# Patient Record
Sex: Male | Born: 1990 | Race: Black or African American | Hispanic: No | Marital: Single | Smoking: Current every day smoker
Health system: Southern US, Community
[De-identification: ages and names within clinical notes are randomized; demographics above are authoritative.]

## PROBLEM LIST (undated history)

## (undated) DIAGNOSIS — I10 Essential (primary) hypertension: Secondary | ICD-10-CM

---

## 2016-04-07 ENCOUNTER — Encounter (HOSPITAL_COMMUNITY): Payer: Self-pay

## 2016-04-07 ENCOUNTER — Emergency Department (HOSPITAL_COMMUNITY)
Admission: EM | Admit: 2016-04-07 | Discharge: 2016-04-07 | Disposition: A | Discharge status: Home / Self Care | Payer: Self-pay | Attending: Physician Assistant | Admitting: Physician Assistant

## 2016-04-07 DIAGNOSIS — Z87891 Personal history of nicotine dependence: Secondary | ICD-10-CM | POA: Insufficient documentation

## 2016-04-07 DIAGNOSIS — H5789 Other specified disorders of eye and adnexa: Secondary | ICD-10-CM

## 2016-04-07 DIAGNOSIS — H578 Other specified disorders of eye and adnexa: Secondary | ICD-10-CM | POA: Insufficient documentation

## 2016-04-07 DIAGNOSIS — H538 Other visual disturbances: Secondary | ICD-10-CM | POA: Insufficient documentation

## 2016-04-07 MED ORDER — FLUORESCEIN SODIUM 1 MG OP STRP
1.0000 | ORAL_STRIP | Freq: Once | OPHTHALMIC | Status: DC
  Filled 2016-04-07: qty 1

## 2016-04-07 MED ORDER — TETRACAINE HCL 0.5 % OP SOLN
2.0000 [drp] | Freq: Once | OPHTHALMIC | Status: DC
  Filled 2016-04-07: qty 2

## 2016-04-07 NOTE — Discharge Instructions (Signed)
There is no signs of abrasions to her cornea today. You need to follow-up with an ophthalmologist. I have referred you and you need to try to schedule an appointment within the next week. Please return to the ED if your vision worsens or if you develop any other symptoms.

## 2016-04-07 NOTE — ED Provider Notes (Signed)
[facility]-EMERGENCY DEPT Provider Note   CSN: [id number] Arrival date & time: 04/07/16  1127  By signing my name below, I, Christopher Patton, attest that this documentation has been prepared under the direction and in the presence of AssurantKennith Nicolas Banh, PA-C. Electronically Signed: Javier Dockerobert Ryan Patton, ER Scribe. 12/15/2015. 12:29 PM.   History   Chief Complaint Chief Complaint  Patient presents with  . Eye Problem   HPI  HPI Comments: Christopher Patton is a 25 y.o. male who presents to the Emergency Department complaining of bilateral eye complaints. He endorses associated eye redness, worse in left along with itching bilaterally and blurry vision that has been ongoing for 6 months. His girlfriend is at bedside and states his eye are red every other day. Patient is a [employer] and thought that maybe something flew into his eyes at work 6 months ago. Denies any pain. He has not seen an eye doctor since he was a child. Patient states he wore glasses as a child and has not been complaint with wearing glasses since he was a child. He has not worn glasses of for the pat 10 years. He feels his vision is worsening. He denies crusting, eye pain, or eye discharge. Denies any other complaints.   History reviewed. No pertinent past medical history.  There are no active problems to display for this patient.   History reviewed. No pertinent surgical history.   Home Medications    Prior to Admission medications   Not on File    Family History No family history on file.  Social History Social History  Substance Use Topics  . Smoking status: Former Smoker    Quit date: 01/07/2016  . Smokeless tobacco: Never Used  . Alcohol use Yes     Comment: occassional     Allergies   Patient has no allergy information on record.   Review of Systems Review of Systems  Constitutional: Negative for chills and fever.  HENT: Negative for congestion, dental problem, ear pain and postnasal drip.   Eyes:  Positive for redness and visual disturbance. Negative for photophobia, pain, discharge and itching.  Neurological: Negative for dizziness and headaches.     Physical Exam Updated Vital Signs BP 145/99 (BP Location: Right Arm)   Pulse 70   Temp 97.4 F (36.3 C) (Oral)   Resp 18   Ht 6' 5.5" (1.969 m)   Wt (!) 345 lb (156.5 kg)   SpO2 100%   BMI 40.38 kg/m   Physical Exam  Constitutional: He is oriented to person, place, and time. He appears well-developed and well-nourished. No distress.  HENT:  Head: Normocephalic and atraumatic.  Right Ear: External ear normal.  Left Ear: External ear normal.  Nose: Nose normal.  Mouth/Throat: Oropharynx is clear and moist.  Eyes: Conjunctivae, EOM and lids are normal. Pupils are equal, round, and reactive to light. Lids are everted and swept, no foreign bodies found. Right eye exhibits no chemosis, no discharge, no exudate and no hordeolum. Left eye exhibits no chemosis, no discharge, no exudate and no hordeolum. Right conjunctiva is not injected. Left conjunctiva is not injected.  Slit lamp exam:      The right eye shows no corneal abrasion, no corneal ulcer, no fluorescein uptake and no anterior chamber bulge.       The left eye shows no corneal abrasion, no corneal ulcer, no fluorescein uptake and no anterior chamber bulge.  Neck: Normal range of motion. Neck supple.  Cardiovascular: Normal rate.  Pulmonary/Chest: Effort normal. No respiratory distress.  Musculoskeletal: Normal range of motion.  Neurological: He is alert and oriented to person, place, and time. Coordination normal.  The patient is alert, attentive, and oriented x 3. Speech is clear. Cranial nerve II-VII grossly intact. Negative pronator drift. Sensation intact. Strength 5/5 in all extremities. Reflexes 2+ and symmetric at biceps, triceps, knees, and ankles. Rapid alternating movement and fine finger movements intact. Romberg is absent. Posture and gait normal. Peripheral  vision intact.  Skin: Skin is warm and dry. He is not diaphoretic.  Psychiatric: He has a normal mood and affect. His behavior is normal.  Nursing note and vitals reviewed.    Visual Acuity  Right Eye Distance: 20/70 Left Eye Distance: 20/50 Bilateral Distance: 20/50  Right Eye Near:   Left Eye Near:    Bilateral Near:      ED Treatments / Results  Labs (all labs ordered are listed, but only abnormal results are displayed) Labs Reviewed - No data to display  EKG  EKG Interpretation None       Radiology No results found.  Procedures Procedures (including critical care time)  Medications Ordered in ED Medications  fluorescein ophthalmic strip 1 strip (not administered)  tetracaine (PONTOCAINE) 0.5 % ophthalmic solution 2 drop (not administered)     Initial Impression / Assessment and Plan / ED Course  I have reviewed the triage vital signs and the nursing notes.  Pertinent labs & imaging results that were available during my care of the patient were reviewed by me and considered in my medical decision making (see chart for details).  Clinical Course   Patient presents to the ED with bilat worsening vision, eye redness, and itching that has been ongoing for the past 6 months. No focal neuro deficits. Low suspicion for CVA. Eye exam unconcerned for corneal abrasion or ulceration or foreign body. Visual acuity poor in both eye. Patient states that when he was younger he was suppose to wear glasses but decided not to. HE feels his vision is worsening. Patient without injected conjunctivae and pain. Low suspicion for conjunctivitis, uevitis, or glaucoma. I feel this patients symptoms are not life threatening and he needs to follow up with ophtho for glasses. Patient is agreeable to the above plan. Pt is hemodynamically stable, in NAD, & able to ambulate in the ED. Pt is comfortable with above plan and is stable for discharge at this time. All questions were answered prior to  disposition. Strict return precautions for f/u to the ED were discussed. Patient discussed with Dr. Corlis LeakMackuen who is agreeable to the above plan.     Final Clinical Impressions(s) / ED Diagnoses   Final diagnoses:  Eye redness  Eye irritation  Blurry vision, bilateral    New Prescriptions New Prescriptions   No medications on file      I personally performed the services described in this documentation, which was scribed in my presence. The recorded information has been reviewed and is accurate.        Rise MuKenneth T Jaylee Lantry, PA-C 04/08/16 2327    Courteney Randall AnLyn Mackuen, MD 04/10/16 1616

## 2016-04-07 NOTE — ED Notes (Signed)
Declined W/C at D/C and was escorted to lobby by RN. 

## 2016-04-07 NOTE — ED Notes (Signed)
PT said that when he was younger he was supposed to wear glasses but decided not to.

## 2016-04-07 NOTE — ED Triage Notes (Signed)
Pt. Woke up with lt. Eye is blurred and red and itchy.  It is also his rt. Eye but the lt. Is worse.  He reports having these symptoms start 6 months ago.  He thinks that something flew into his eyes under his safety glasses.  Skin is warm and dry.  Pt. 's  Girlfriend stated that his eye is red every other day.

## 2016-07-09 ENCOUNTER — Ambulatory Visit (HOSPITAL_COMMUNITY)
Admission: EM | Admit: 2016-07-09 | Discharge: 2016-07-09 | Disposition: A | Discharge status: Home / Self Care | Payer: BLUE CROSS/BLUE SHIELD | Attending: Family Medicine | Admitting: Family Medicine

## 2016-07-09 ENCOUNTER — Encounter (HOSPITAL_COMMUNITY): Payer: Self-pay | Admitting: Emergency Medicine

## 2016-07-09 DIAGNOSIS — J029 Acute pharyngitis, unspecified: Secondary | ICD-10-CM | POA: Insufficient documentation

## 2016-07-09 DIAGNOSIS — B9789 Other viral agents as the cause of diseases classified elsewhere: Secondary | ICD-10-CM

## 2016-07-09 DIAGNOSIS — J069 Acute upper respiratory infection, unspecified: Secondary | ICD-10-CM

## 2016-07-09 HISTORY — DX: Essential (primary) hypertension: I10

## 2016-07-09 LAB — POCT URINALYSIS DIP (DEVICE)
BILIRUBIN URINE: NEGATIVE
Glucose, UA: NEGATIVE mg/dL
HGB URINE DIPSTICK: NEGATIVE
KETONES UR: NEGATIVE mg/dL
LEUKOCYTES UA: NEGATIVE
NITRITE: NEGATIVE
Protein, ur: NEGATIVE mg/dL
Specific Gravity, Urine: 1.025 (ref 1.005–1.030)
Urobilinogen, UA: 0.2 mg/dL (ref 0.0–1.0)
pH: 6.5 (ref 5.0–8.0)

## 2016-07-09 LAB — POCT INFECTIOUS MONO SCREEN: Mono Screen: NEGATIVE

## 2016-07-09 LAB — POCT RAPID STREP A: Streptococcus, Group A Screen (Direct): NEGATIVE

## 2016-07-09 MED ORDER — IPRATROPIUM BROMIDE 0.06 % NA SOLN: 2.0000 | Freq: Four times a day (QID) | NASAL | 0 refills | Status: DC

## 2016-07-09 NOTE — ED Provider Notes (Signed)
CSN: [id number]     Arrival date & time 07/09/16  1640 History   None    Chief Complaint  Patient presents with  . Jaw Pain    lower   (Consider location/radiation/quality/duration/timing/severity/associated sxs/prior Treatment) Patinet c/o sore throat and lymph node swelling.  He recently was treated for UTI.   The history is provided by the patient.  URI  Presenting symptoms: congestion and fatigue   Severity:  Mild Onset quality:  Sudden Timing:  Constant Progression:  Worsening Chronicity:  New Relieved by:  Nothing Worsened by:  Nothing Ineffective treatments:  None tried   Past Medical History:  Diagnosis Date  . Hypertension    History reviewed. No pertinent surgical history. History reviewed. No pertinent family history. Social History  Substance Use Topics  . Smoking status: Current Every Day Smoker    Types: Cigars  . Smokeless tobacco: Never Used     Comment: Black & Milds 3-4/day  . Alcohol use Yes     Comment: occassional    Review of Systems  Constitutional: Positive for fatigue.  HENT: Positive for congestion.   Eyes: Negative.   Respiratory: Negative.   Cardiovascular: Negative.   Gastrointestinal: Negative.   Endocrine: Negative.   Genitourinary: Negative.   Musculoskeletal: Negative.   Allergic/Immunologic: Negative.   Neurological: Negative.   Hematological: Negative.   Psychiatric/Behavioral: Negative.     Allergies  Patient has no known allergies.  Home Medications   Prior to Admission medications   Medication Sig Start Date End Date Taking? Authorizing Provider  cetirizine (ZYRTEC) 10 MG tablet Take 10 mg by mouth daily.   Yes Historical Provider, MD  ipratropium (ATROVENT) 0.06 % nasal spray Place 2 sprays into both nostrils 4 (four) times daily. 07/09/16   Deatra CanterWilliam J Oxford, FNP   Meds Ordered and Administered this Visit  Medications - No data to display  BP 157/95 (BP Location: Left Arm)   Pulse 92   Temp 98.2 F (36.8 C)  (Oral)   SpO2 99%  No data found.   Physical Exam  Constitutional: He is oriented to person, place, and time. He appears well-developed and well-nourished.  HENT:  Head: Normocephalic and atraumatic.  Right Ear: External ear normal.  Left Ear: External ear normal.  Mouth/Throat: Oropharynx is clear and moist.  Eyes: Conjunctivae and EOM are normal. Pupils are equal, round, and reactive to light.  Neck: Normal range of motion. Neck supple.  Cardiovascular: Normal rate, regular rhythm and normal heart sounds.   Pulmonary/Chest: Effort normal and breath sounds normal.  Abdominal: Soft.  Neurological: He is alert and oriented to person, place, and time.  Nursing note and vitals reviewed.   Urgent Care Course     Procedures (including critical care time)  Labs Review Labs Reviewed  CULTURE, GROUP A STREP [facility]  POCT URINALYSIS DIP (DEVICE)  POCT INFECTIOUS MONO SCREEN  POCT RAPID STREP A    Imaging Review No results found.   Visual Acuity Review  Right Eye Distance:   Left Eye Distance:   Bilateral Distance:    Right Eye Near:   Left Eye Near:    Bilateral Near:         MDM   1. Viral URI    Ipratropium nasal spray  Push po fluids, rest, tylenol and motrin otc prn as directed for fever, arthralgias, and myalgias.  Follow up prn if sx's continue or persist.    Deatra CanterWilliam J Oxford, FNP 07/09/16 2043

## 2016-07-09 NOTE — ED Triage Notes (Signed)
Pt states he had a mild sore throat on Sunday.  Since then he has had pain in his lower jaw, below the chin.  He says his voice has become hoarse and his jaw will hurt when he swallows. He was treated for a UTI and then he started having these symptoms.

## 2016-07-10 LAB — CULTURE, GROUP A STREP (THRC)

## 2017-09-04 ENCOUNTER — Emergency Department (HOSPITAL_COMMUNITY)
Admission: EM | Admit: 2017-09-04 | Discharge: 2017-09-05 | Disposition: A | Discharge status: Home / Self Care | Payer: Self-pay | Attending: Emergency Medicine | Admitting: Emergency Medicine

## 2017-09-04 ENCOUNTER — Emergency Department (HOSPITAL_COMMUNITY): Payer: Self-pay

## 2017-09-04 ENCOUNTER — Encounter (HOSPITAL_COMMUNITY): Payer: Self-pay

## 2017-09-04 ENCOUNTER — Other Ambulatory Visit: Payer: Self-pay

## 2017-09-04 DIAGNOSIS — I10 Essential (primary) hypertension: Secondary | ICD-10-CM | POA: Insufficient documentation

## 2017-09-04 DIAGNOSIS — R002 Palpitations: Secondary | ICD-10-CM | POA: Insufficient documentation

## 2017-09-04 DIAGNOSIS — F17210 Nicotine dependence, cigarettes, uncomplicated: Secondary | ICD-10-CM | POA: Insufficient documentation

## 2017-09-04 DIAGNOSIS — R0789 Other chest pain: Secondary | ICD-10-CM | POA: Insufficient documentation

## 2017-09-04 DIAGNOSIS — R0602 Shortness of breath: Secondary | ICD-10-CM | POA: Insufficient documentation

## 2017-09-04 LAB — CBC
HCT: 42.3 % (ref 39.0–52.0)
Hemoglobin: 14.2 g/dL (ref 13.0–17.0)
MCH: 27 pg (ref 26.0–34.0)
MCHC: 33.6 g/dL (ref 30.0–36.0)
MCV: 80.6 fL (ref 78.0–100.0)
PLATELETS: 267 10*3/uL (ref 150–400)
RBC: 5.25 MIL/uL (ref 4.22–5.81)
RDW: 14.5 % (ref 11.5–15.5)
WBC: 7.5 10*3/uL (ref 4.0–10.5)

## 2017-09-04 LAB — BASIC METABOLIC PANEL
Anion gap: 11 (ref 5–15)
BUN: 10 mg/dL (ref 6–20)
CHLORIDE: 100 mmol/L — AB (ref 101–111)
CO2: 25 mmol/L (ref 22–32)
CREATININE: 1.1 mg/dL (ref 0.61–1.24)
Calcium: 9 mg/dL (ref 8.9–10.3)
Glucose, Bld: 124 mg/dL — ABNORMAL HIGH (ref 65–99)
Potassium: 3.5 mmol/L (ref 3.5–5.1)
SODIUM: 136 mmol/L (ref 135–145)

## 2017-09-04 LAB — I-STAT TROPONIN, ED: TROPONIN I, POC: 0.01 ng/mL (ref 0.00–0.08)

## 2017-09-04 NOTE — ED Triage Notes (Addendum)
Pt endorses "my heart just started pounding" and shob that began 30 minutes ago while having an argument with his significant other. Tachycardic 112 in triage. Has hx of htn. Denies CP. Speaking in complete sentences.

## 2017-09-04 NOTE — ED Notes (Signed)
Due to xray results of congestion, and hypertensive presentation, will upgrade acuity on patient to focus rooming

## 2017-09-05 LAB — I-STAT TROPONIN, ED: Troponin i, poc: 0 ng/mL (ref 0.00–0.08)

## 2017-09-05 LAB — BRAIN NATRIURETIC PEPTIDE: B NATRIURETIC PEPTIDE 5: 11 pg/mL (ref 0.0–100.0)

## 2017-09-05 NOTE — Discharge Instructions (Signed)
Your labs today were normal.  You were found to have pulmonary vascular congestion on your chest x-ray.  No signs of fluid around your heart or lungs today but we recommend close follow-up with her primary care physician and cardiologist for an outpatient echocardiogram.   To find a primary care or specialty doctor please call [phone] or [phone] to access "[place] Find a Doctor Service."  You may also go on the [facility] website at [email or web address]  There are also multiple [facility], [facility], [facility] practices throughout the [place] that are frequently accepting new patients. You may find a clinic that is close to your home and contact them.  [facility] -  [address] [place] [address] [phone]   [facility] Department -  [address] [place] [address] [phone]   [facility] Department [phone]  [place] [address] [phone]

## 2017-09-05 NOTE — ED Provider Notes (Signed)
TIME SEEN: 3:06 AM  CHIEF COMPLAINT: Chest pain, shortness of breath, palpitations  HPI: Patient is a 27 year old male with history of obesity, tobacco use, hypertension who presents to the emergency department with chest pain and shortness of breath.  States tonight around 9:15 PM after smoking a cigarette he felt like his heart was beating fast and irregularly.  States he had some left-sided chest tightness and intermittent shortness of breath.  Symptoms have resolved.  No nausea or vomiting.  No diaphoresis or dizziness.  Denies any cardiac history.  No history of PE or DVT.  No lower extremity swelling or pain.  ROS: See HPI Constitutional: no fever  Eyes: no drainage  ENT: no runny nose   Cardiovascular:   chest pain  Resp:  SOB  GI: no vomiting GU: no dysuria Integumentary: no rash  Allergy: no hives  Musculoskeletal: no leg swelling  Neurological: no slurred speech ROS otherwise negative  PAST MEDICAL HISTORY/PAST SURGICAL HISTORY:  Past Medical History:  Diagnosis Date  . Hypertension     MEDICATIONS:  Prior to Admission medications   Medication Sig Start Date End Date Taking? Authorizing Provider  ipratropium (ATROVENT) 0.06 % nasal spray Place 2 sprays into both nostrils 4 (four) times daily. Patient not taking: Reported on 09/05/2017 07/09/16   Deatra Canter, FNP    ALLERGIES:  No Known Allergies  SOCIAL HISTORY:  Social History   Tobacco Use  . Smoking status: Current Every Day Smoker    Types: Cigars  . Smokeless tobacco: Never Used  . Tobacco comment: Black & Milds 3-4/day  Substance Use Topics  . Alcohol use: Yes    Comment: occassional    FAMILY HISTORY: History reviewed. No pertinent family history.  EXAM: BP (!) 152/103 (BP Location: Left Arm)   Pulse 86   Temp 97.9 F (36.6 C) (Oral)   Resp 16   Ht  (1.956 m)   Wt (!) 156.5 kg (345 lb)   SpO2 100%   BMI 40.91 kg/m  CONSTITUTIONAL: Alert and oriented and responds appropriately to  questions. Well-appearing; well-nourished [place] HEAD: Normocephalic EYES: Conjunctivae clear, pupils appear equal, EOMI ENT: normal nose; moist mucous membranes NECK: Supple, no meningismus, no nuchal rigidity, no LAD  CARD: RRR; S1 and S2 appreciated; no murmurs, no clicks, no rubs, no gallops RESP: Normal chest excursion without splinting or tachypnea; breath sounds clear and equal bilaterally; no wheezes, no rhonchi, no rales, no hypoxia or respiratory distress, speaking full sentences ABD/GI: Normal bowel sounds; non-distended; soft, non-tender, no rebound, no guarding, no peritoneal signs, no hepatosplenomegaly BACK:  The back appears normal and is non-tender to palpation, there is no CVA tenderness EXT: Normal ROM in all joints; non-tender to palpation; no edema; normal capillary refill; no cyanosis, no calf tenderness or swelling    SKIN: Normal color for age and race; warm; no rash NEURO: Moves all extremities equally PSYCH: The patient's mood and manner are appropriate. Grooming and personal hygiene are appropriate.  MEDICAL DECISION MAKING: Patient here with palpitations, chest pain or shortness of breath.  He does have risk factors for ACS.  First troponin is negative.  EKG shows no ischemic abnormality or arrhythmia.  Chest x-ray shows pulmonary vascular congestion without edema.  No cardiomegaly noted.  His BNP is normal at 11.  Plan is to repeat second troponin and if this is negative discharge with close outpatient follow-up with cardiology as he may benefit from echocardiogram.  His blood pressure has improved without intervention  in the ED.  Doubt PE.  Doubt dissection.  ED PROGRESS: Second troponin is negative.  We will have him follow-up with PCP and cardiology as an outpatient.  Discussed return precautions.   At this time, I do not feel there is any life-threatening condition present. I have reviewed and discussed all results (EKG, imaging, lab, urine as appropriate) and exam  findings with patient/family. I have reviewed nursing notes and appropriate previous records.  I feel the patient is safe to be discharged home without further emergent workup and can continue workup as an outpatient as needed. Discussed usual and customary return precautions. Patient/family verbalize understanding and are comfortable with this plan.  Outpatient follow-up has been provided if needed. All questions have been answered.      EKG Interpretation  Date/Time:  Friday Sep 04 2017 21:51:48 EDT Ventricular Rate:  108 PR Interval:  176 QRS Duration: 82 QT Interval:  350 QTC Calculation: 469 R Axis:   81 Text Interpretation:  Sinus tachycardia Otherwise normal ECG No old tracing to compare Confirmed by Adonias Demore, Baxter Hire [phone]) on 09/05/2017 3:07:02 AM         Jhace Fennell, Layla Maw, DO 09/05/17 6045

## 2017-10-05 ENCOUNTER — Other Ambulatory Visit: Payer: Self-pay

## 2017-10-05 ENCOUNTER — Emergency Department (HOSPITAL_COMMUNITY): Payer: Self-pay

## 2017-10-05 ENCOUNTER — Emergency Department (HOSPITAL_COMMUNITY)
Admission: EM | Admit: 2017-10-05 | Discharge: 2017-10-05 | Disposition: A | Discharge status: Home / Self Care | Payer: Self-pay | Attending: Emergency Medicine | Admitting: Emergency Medicine

## 2017-10-05 ENCOUNTER — Encounter (HOSPITAL_COMMUNITY): Payer: Self-pay | Admitting: Emergency Medicine

## 2017-10-05 DIAGNOSIS — R42 Dizziness and giddiness: Secondary | ICD-10-CM | POA: Insufficient documentation

## 2017-10-05 DIAGNOSIS — I10 Essential (primary) hypertension: Secondary | ICD-10-CM | POA: Insufficient documentation

## 2017-10-05 DIAGNOSIS — F17290 Nicotine dependence, other tobacco product, uncomplicated: Secondary | ICD-10-CM | POA: Insufficient documentation

## 2017-10-05 LAB — BASIC METABOLIC PANEL
ANION GAP: 8 (ref 5–15)
BUN: 6 mg/dL (ref 6–20)
CALCIUM: 9.1 mg/dL (ref 8.9–10.3)
CO2: 27 mmol/L (ref 22–32)
Chloride: 103 mmol/L (ref 101–111)
Creatinine, Ser: 1.11 mg/dL (ref 0.61–1.24)
Glucose, Bld: 114 mg/dL — ABNORMAL HIGH (ref 65–99)
POTASSIUM: 4.2 mmol/L (ref 3.5–5.1)
Sodium: 138 mmol/L (ref 135–145)

## 2017-10-05 LAB — I-STAT TROPONIN, ED: Troponin i, poc: 0 ng/mL (ref 0.00–0.08)

## 2017-10-05 LAB — CBC
HEMATOCRIT: 44.9 % (ref 39.0–52.0)
HEMOGLOBIN: 14.2 g/dL (ref 13.0–17.0)
MCH: 26.1 pg (ref 26.0–34.0)
MCHC: 31.6 g/dL (ref 30.0–36.0)
MCV: 82.4 fL (ref 78.0–100.0)
Platelets: 269 10*3/uL (ref 150–400)
RBC: 5.45 MIL/uL (ref 4.22–5.81)
RDW: 15.1 % (ref 11.5–15.5)
WBC: 4.5 10*3/uL (ref 4.0–10.5)

## 2017-10-05 LAB — URINALYSIS, ROUTINE W REFLEX MICROSCOPIC
BILIRUBIN URINE: NEGATIVE
Glucose, UA: NEGATIVE mg/dL
Hgb urine dipstick: NEGATIVE
Ketones, ur: NEGATIVE mg/dL
Leukocytes, UA: NEGATIVE
Nitrite: NEGATIVE
PH: 7 (ref 5.0–8.0)
Protein, ur: NEGATIVE mg/dL
SPECIFIC GRAVITY, URINE: 1.002 — AB (ref 1.005–1.030)

## 2017-10-05 LAB — CBG MONITORING, ED: GLUCOSE-CAPILLARY: 97 mg/dL (ref 65–99)

## 2017-10-05 LAB — D-DIMER, QUANTITATIVE: D-Dimer, Quant: <0.27 ug/mL-FEU (ref 0.00–0.50)

## 2017-10-05 MED ORDER — SODIUM CHLORIDE 0.9 % IV BOLUS
1000.0000 mL | Freq: Once | INTRAVENOUS | Status: AC
Start: 2017-10-05 — End: 2017-10-05
  Administered 2017-10-05: 1000 mL via INTRAVENOUS

## 2017-10-05 MED ORDER — MECLIZINE HCL 25 MG PO TABS: 25.0000 mg | ORAL_TABLET | Freq: Three times a day (TID) | ORAL | 0 refills | Status: DC | PRN

## 2017-10-05 MED ORDER — MECLIZINE HCL 25 MG PO TABS
25.0000 mg | ORAL_TABLET | Freq: Once | ORAL | Status: AC
  Administered 2017-10-05: 25 mg via ORAL
  Filled 2017-10-05: qty 1

## 2017-10-05 NOTE — ED Provider Notes (Signed)
[facility] EMERGENCY DEPARTMENT Provider Note   CSN: [id number] Arrival date & time: 10/05/17  4098     History   Chief Complaint Chief Complaint  Patient presents with  . Dizziness    HPI Christopher Patton is a 27 y.o. male past medical history of hypertension who presents for evaluation of lightheadedness, intermittent chest pain is been ongoing for last couple days.  Patient reports that he was recently in [place] and states that he was working at a job and states that he thinks he got dehydrated.  Patient states that he was working indoors and states that there is a air conditioning it was not easily ventilated.  Patient reports that he had some episodes where he got dizzy and felt lightheaded like he was going to pass out.  Patient reports that he traveled home Saturday.  He states that he went on 09/22/17 via a 3-hour airplane ride and returned 2 days ago.  Patient reports that this morning, he had some of the same feelings of lightheadedness and dizziness.  He states that it is worse when he moves his head or stands up from a sitting position.  He denies any nausea or vomiting.  Patient reports that he has had some intermittent chest pain.  Patient originally stated that this is been ongoing for several months but states that it worsened in the last few days.  Is not worse with exertion or deep inspiration.  He has no associated nausea, vomiting, diaphoresis.  Patient has been able to ambulate without any difficulty.  Denies any preceding trauma, injury, fall.  Patient has been in his normal state of health.  He does smoke black in miles.  He denies any cocaine use.  Patient also reports he has had some intermittent polydipsia and polyuria.  Patient reports that over the last few days, he is going to the bathroom approximately 5-6 times a day.  He states he is not having any nocturia.  Patient denies any fevers, vision changes, difficulty breathing, abdominal pain,  numbness/weakness of his arms or legs.  Patient denies any surgeries, hospitalizations, leg swelling, history of DVT or blood clots.  He does report that he recently had to 3-hour plane rides in the last 2 weeks.  The history is provided by the patient.    Past Medical History:  Diagnosis Date  . Hypertension     There are no active problems to display for this patient.   History reviewed. No pertinent surgical history.      Home Medications    Prior to Admission medications   Medication Sig Start Date End Date Taking? Authorizing Provider  bismuth subsalicylate (PEPTO BISMOL) 262 MG chewable tablet Chew 524 mg by mouth as needed for indigestion or diarrhea or loose stools.   Yes [provider]  cetirizine (ZYRTEC) 10 MG tablet Take 10 mg by mouth as needed for allergies.   Yes [provider]  ipratropium (ATROVENT) 0.06 % nasal spray Place 2 sprays into both nostrils 4 (four) times daily. Patient not taking: Reported on 09/05/2017 07/09/16   Deatra Canter, FNP  meclizine (ANTIVERT) 25 MG tablet Take 1 tablet (25 mg total) by mouth 3 (three) times daily as needed for dizziness. 10/05/17   Maxwell Caul, PA-C    Family History History reviewed. No pertinent family history.  Social History Social History   Tobacco Use  . Smoking status: Current Every Day Smoker    Types: Cigars  . Smokeless tobacco:  Never Used  . Tobacco comment: Black & Milds 3-4/day  Substance Use Topics  . Alcohol use: Yes    Comment: occassional  . Drug use: No     Allergies   Patient has no known allergies.   Review of Systems Review of Systems  Constitutional: Negative for chills and fever.  Eyes: Negative for visual disturbance.  Respiratory: Negative for shortness of breath.   Cardiovascular: Positive for chest pain. Negative for leg swelling.  Gastrointestinal: Negative for abdominal pain, nausea and vomiting.  Endocrine: Positive for polydipsia and polyuria.    Musculoskeletal: Negative for back pain and neck pain.  Neurological: Positive for dizziness and light-headedness. Negative for weakness and numbness.  All other systems reviewed and are negative.    Physical Exam Updated Vital Signs BP 132/78   Pulse 65   Temp 98 F (36.7 C)   Resp 20   Ht 6\' 5"  (1.956 m)   Wt (!) 163.3 kg (360 lb)   SpO2 100%   BMI 42.69 kg/m   Physical Exam  Constitutional: He is oriented to person, place, and time. He appears well-developed and well-nourished.  HENT:  Head: Normocephalic and atraumatic.  Mouth/Throat: Oropharynx is clear and moist and mucous membranes are normal.  Eyes: Pupils are equal, round, and reactive to light. Conjunctivae and lids are normal. Right eye exhibits nystagmus. Left eye exhibits nystagmus.  Slight horizontal nystagmus noted to left.  No vertical or rotational nystagmus.   Neck: Full passive range of motion without pain.  Cardiovascular: Normal rate, regular rhythm, normal heart sounds and normal pulses. Exam reveals no gallop and no friction rub.  No murmur heard. Pulmonary/Chest: Effort normal and breath sounds normal.  Lungs clear to auscultation bilaterally.  Symmetric chest rise.  No wheezing, rales, rhonchi.  Abdominal: Soft. Normal appearance. There is no tenderness. There is no rigidity and no guarding.  Musculoskeletal: Normal range of motion.  Bilateral lower extremities are symmetric in appearance.  No overlying warmth, erythema, edema.  Neurological: He is alert and oriented to person, place, and time.  Cranial nerves III-XII intact Follows commands, Moves all extremities  5/5 strength to BUE and BLE  Sensation intact throughout all major nerve distributions Normal finger to nose. No dysdiadochokinesia. No pronator drift. No gait abnormalities  No slurred speech. No facial droop.   Skin: Skin is warm and dry. Capillary refill takes less than 2 seconds.  Psychiatric: He has a normal mood and affect. His  speech is normal.  Nursing note and vitals reviewed.    ED Treatments / Results  Labs (all labs ordered are listed, but only abnormal results are displayed) Labs Reviewed  BASIC METABOLIC PANEL - Abnormal; Notable for the following components:      Result Value   Glucose, Bld 114 (*)    All other components within normal limits  URINALYSIS, ROUTINE W REFLEX MICROSCOPIC - Abnormal; Notable for the following components:   Color, Urine STRAW (*)    Specific Gravity, Urine 1.002 (*)    All other components within normal limits  CBC  D-DIMER, QUANTITATIVE (NOT AT [facility])  CBG MONITORING, ED  I-STAT TROPONIN, ED    EKG None  Radiology Dg Chest 2 View  Result Date: 10/05/2017 CLINICAL DATA:  Dizziness and chest pain EXAM: CHEST - 2 VIEW COMPARISON:  09/04/2017 FINDINGS: Cardiac shadow is stable. The lungs are well aerated bilaterally. Mild vascular congestion is again seen without significant interstitial edema or infiltrate. No effusions are seen. No bony abnormality  is noted. IMPRESSION: Stable mild vascular congestion. Electronically Signed   By: Alcide CleverMark  Lukens M.D.   On: 10/05/2017 10:44    Procedures Procedures (including critical care time)  Medications Ordered in ED Medications  sodium chloride 0.9 % bolus 1,000 mL (0 mLs Intravenous Stopped 10/05/17 1145)  meclizine (ANTIVERT) tablet 25 mg (25 mg Oral Given 10/05/17 1012)     Initial Impression / Assessment and Plan / ED Course  I have reviewed the triage vital signs and the nursing notes.  Pertinent labs & imaging results that were available during my care of the patient were reviewed by me and considered in my medical decision making (see chart for details).     27 year old male who presents for evaluation of dizziness and lightheadedness that is been intermittent for the last few days.  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  Thinks he is dehydrated.  No recent fevers, vision changes,  numbness/weakness of his extremities.  Does report he is having some intermittent chest pain that has been ongoing for several weeks. Patient is afebrile, non-toxic appearing, sitting comfortably on examination table. Vital signs reviewed and stable. On exam, he does have some horizontal nystagmus noted to the left.  No vertical or rotational nystagmus.  No neuro deficits noted on exam.  Consider infectious etiology versus vertigo versus dehydration.  Low suspicion for PE but also consideration given recent travel.  Also consider ACS etiology, though patient is low risk.  3/physical exam is not concerning for CVA, ICH. Plan for fluids and antiemetics.  I-STAT troponin negative.  Given the patient's pain has been ongoing for the last several weeks, one troponin is sufficient for rule out.  D-dimer is negative.  UA negative for any acute infectious etiology.  BMP shows slight hyperglycemia 114 but otherwise unremarkable.  CBC without any significant leukocytosis, anemia.  X-ray shows some mild vascular congestion.  Otherwise unremarkable.  EKG shows normal sinus rhythm.  Discussed results with patient.  He reports feeling better after fluids and medications.  Patient is able to ambulate in the department any difficulty.  He is tolerated p.o. in the department any difficulty.  Patient stable for discharge at this time.  Plan for outpatient referral [facility] for further primary care evaluation. Patient had ample opportunity for questions and discussion. All patient's questions were answered with full understanding. Strict return precautions discussed. Patient expresses understanding and agreement to plan.   Final Clinical Impressions(s) / ED Diagnoses   Final diagnoses:  Dizziness  Vertigo    ED Discharge Orders        Ordered    meclizine (ANTIVERT) 25 MG tablet  3 times daily PRN,   Status:  Discontinued     10/05/17 1345    meclizine (ANTIVERT) 25 MG tablet  3 times daily PRN      10/05/17 1348       Maxwell CaulLayden, Emunah Texidor A, PA-C 10/05/17 1613    Arby BarrettePfeiffer, Marcy, MD 10/16/17 [phone]

## 2017-10-05 NOTE — ED Notes (Signed)
Pt's CBG result was 97. Informed Alyson ReedyNicki K. - RN.

## 2017-10-05 NOTE — Discharge Instructions (Signed)
You can take Tylenol or Ibuprofen as directed for pain. You can alternate Tylenol and Ibuprofen every 4 hours. If you take Tylenol at 1pm, then you can take Ibuprofen at 5pm. Then you can take Tylenol again at 9pm.   Take meclizine as directed.   [facility] for further evaluation.  Return the emergency department for any fever, vision change, chest pain, difficulty breathing, numbness/weakness of your extremities or any other worsening or concerning symptoms.

## 2017-10-05 NOTE — ED Notes (Signed)
Patient transported to X-ray 

## 2017-10-05 NOTE — ED Triage Notes (Signed)
Pt arrives via POV from home states woke this morning feeling lightheaded, dizzy, polyuria, polydipsia. Pt afebrile, alert, oriented x4, no slurred speech, droop, drift, VAN neg. VSS.

## 2017-10-08 ENCOUNTER — Other Ambulatory Visit: Payer: Self-pay

## 2017-10-08 ENCOUNTER — Emergency Department (HOSPITAL_COMMUNITY): Payer: Self-pay

## 2017-10-08 ENCOUNTER — Emergency Department (HOSPITAL_COMMUNITY)
Admission: EM | Admit: 2017-10-08 | Discharge: 2017-10-08 | Disposition: A | Discharge status: Home / Self Care | Payer: Self-pay | Attending: Emergency Medicine | Admitting: Emergency Medicine

## 2017-10-08 ENCOUNTER — Encounter (HOSPITAL_COMMUNITY): Payer: Self-pay | Admitting: Emergency Medicine

## 2017-10-08 DIAGNOSIS — R0789 Other chest pain: Secondary | ICD-10-CM | POA: Insufficient documentation

## 2017-10-08 DIAGNOSIS — I10 Essential (primary) hypertension: Secondary | ICD-10-CM | POA: Insufficient documentation

## 2017-10-08 DIAGNOSIS — F17210 Nicotine dependence, cigarettes, uncomplicated: Secondary | ICD-10-CM | POA: Insufficient documentation

## 2017-10-08 LAB — CBC
HEMATOCRIT: 45.5 % (ref 39.0–52.0)
Hemoglobin: 14.7 g/dL (ref 13.0–17.0)
MCH: 26.2 pg (ref 26.0–34.0)
MCHC: 32.3 g/dL (ref 30.0–36.0)
MCV: 81.1 fL (ref 78.0–100.0)
PLATELETS: 275 10*3/uL (ref 150–400)
RBC: 5.61 MIL/uL (ref 4.22–5.81)
RDW: 14.7 % (ref 11.5–15.5)
WBC: 5.1 10*3/uL (ref 4.0–10.5)

## 2017-10-08 LAB — I-STAT TROPONIN, ED: Troponin i, poc: 0 ng/mL (ref 0.00–0.08)

## 2017-10-08 LAB — BASIC METABOLIC PANEL
Anion gap: 8 (ref 5–15)
BUN: 5 mg/dL — AB (ref 6–20)
CHLORIDE: 103 mmol/L (ref 101–111)
CO2: 26 mmol/L (ref 22–32)
Calcium: 9.4 mg/dL (ref 8.9–10.3)
Creatinine, Ser: 1.03 mg/dL (ref 0.61–1.24)
GFR calc Af Amer: >60 mL/min (ref >60)
GFR calc non Af Amer: >60 mL/min (ref >60)
Glucose, Bld: 102 mg/dL — ABNORMAL HIGH (ref 65–99)
POTASSIUM: 4 mmol/L (ref 3.5–5.1)
Sodium: 137 mmol/L (ref 135–145)

## 2017-10-08 NOTE — ED Notes (Signed)
States got back from [place] on 5/31 was seen here at [facility] and d/c now last night he had sob and rt sided cp

## 2017-10-08 NOTE — ED Provider Notes (Signed)
[facility] EMERGENCY DEPARTMENT Provider Note   CSN: [id number] Arrival date & time: 10/08/17  1009     History   Chief Complaint Chief Complaint  Patient presents with  . Chest Pain    HPI Christopher Patton is a 27 y.o. male.  HPI  27 year old male presents today with complaints of chest pressure.  Patient notes he was at work today and was sent by his employer for a work note.  Patient notes he has had chest pressure diffusely to the anterior chest since May 31.  He notes this started after a trip to from [place] with an approximate 3-hour plane ride.  Patient notes it has been persistent, he notes some dizziness the other day, none today.  Patient notes an episode of sharp pain on the right yesterday.  Patient denies any infectious etiology including cough congestion.  Patient notes some minor shortness of breath.  He denies any history of pulmonary embolism, DVT or any significant risk factors, denies any lower extremity swelling or edema.  Patient denies any personal cardiac history does note family history of MI.  Patient notes a history of hypertension, no other chronic medical conditions.  Past Medical History:  Diagnosis Date  . Hypertension     There are no active problems to display for this patient.   History reviewed. No pertinent surgical history.      Home Medications    Prior to Admission medications   Medication Sig Start Date End Date Taking? Authorizing Provider  bismuth subsalicylate (PEPTO BISMOL) 262 MG chewable tablet Chew 524 mg by mouth as needed for indigestion or diarrhea or loose stools.    [provider]  cetirizine (ZYRTEC) 10 MG tablet Take 10 mg by mouth as needed for allergies.    [provider]  ipratropium (ATROVENT) 0.06 % nasal spray Place 2 sprays into both nostrils 4 (four) times daily. Patient not taking: Reported on 09/05/2017 07/09/16   Deatra Canterxford, William J, FNP  meclizine (ANTIVERT) 25 MG tablet Take 1  tablet (25 mg total) by mouth 3 (three) times daily as needed for dizziness. 10/05/17   Maxwell CaulLayden, Lindsey A, PA-C    Family History History reviewed. No pertinent family history.  Social History Social History   Tobacco Use  . Smoking status: Current Every Day Smoker    Types: Cigars  . Smokeless tobacco: Never Used  . Tobacco comment: Black & Milds 3-4/day  Substance Use Topics  . Alcohol use: Yes    Comment: occassional  . Drug use: No     Allergies   Patient has no known allergies.   Review of Systems Review of Systems  All other systems reviewed and are negative.    Physical Exam Updated Vital Signs BP (!) 165/90 (BP Location: Right Arm)   Pulse 73   Temp 98.2 F (36.8 C) (Oral)   Resp 16   Ht 6\' 5"  (1.956 m)   Wt (!) 163.3 kg (360 lb)   SpO2 100%   BMI 42.69 kg/m   Physical Exam  Constitutional: He is oriented to person, place, and time. He appears well-developed and well-nourished.  HENT:  Head: Normocephalic and atraumatic.  Eyes: Pupils are equal, round, and reactive to light. Conjunctivae are normal. Right eye exhibits no discharge. Left eye exhibits no discharge. No scleral icterus.  Neck: Normal range of motion. No JVD present. No tracheal deviation present.  Cardiovascular: Normal rate, regular rhythm, normal heart sounds and intact distal pulses. Exam reveals no  gallop and no friction rub.  No murmur heard. Pulmonary/Chest: Effort normal and breath sounds normal. No stridor. No respiratory distress. He has no wheezes. He has no rales. He exhibits no tenderness.  Tenderness palpation anterior right ribs  Musculoskeletal:  No lower extremity swelling or edema  Neurological: He is alert and oriented to person, place, and time. Coordination normal.  Psychiatric: He has a normal mood and affect. His behavior is normal. Judgment and thought content normal.  Nursing note and vitals reviewed.   ED Treatments / Results  Labs (all labs ordered are listed,  but only abnormal results are displayed) Labs Reviewed  BASIC METABOLIC PANEL - Abnormal; Notable for the following components:      Result Value   Glucose, Bld 102 (*)    BUN 5 (*)    All other components within normal limits  CBC  I-STAT TROPONIN, ED    EKG None  Radiology Dg Chest 2 View  Result Date: 10/08/2017 CLINICAL DATA:  Chest pain. EXAM: CHEST - 2 VIEW COMPARISON:  October 05, 2016 FINDINGS: The heart size and mediastinal contours are within normal limits. Both lungs are clear. The visualized skeletal structures are unremarkable. IMPRESSION: No active cardiopulmonary disease. Electronically Signed   By: Gerome Sam III M.D   On: 10/08/2017 11:53    Procedures Procedures (including critical care time)  Medications Ordered in ED Medications - No data to display   Initial Impression / Assessment and Plan / ED Course  I have reviewed the triage vital signs and the nursing notes.  Pertinent labs & imaging results that were available during my care of the patient were reviewed by me and considered in my medical decision making (see chart for details).    Labs: I stat trop, bmp, cbc,   Imaging: DG Chest 2 view, ED eKG-no ST depression or elevation, normal sinus rhythm  Consults:  Therapeutics:  Discharge Meds:   Assessment/Plan: 27 year old male presents today with complaints of chest pain.  Patient has had previous evaluation with negative d-dimer reassuring EKG and negative troponin.  Again he has negative troponin here, very low suspicion for PE, dissection, ACS.  This is likely muscular in nature.  Patient has clear lung sounds reassuring vital signs and reassuring laboratory analysis.  Patient be encouraged to follow-up as an outpatient, he is given strict return precautions.  Patient verbalized understanding and agreement to today's plan had no further questions or concerns.   Final Clinical Impressions(s) / ED Diagnoses   Final diagnoses:  Chest wall pain     ED Discharge Orders    None       Eyvonne Mechanic, PA-C 10/08/17 1254    Arby Barrette, MD 10/09/17 1544

## 2017-10-08 NOTE — Discharge Instructions (Addendum)
Please read attached information. If you experience any new or worsening signs or symptoms please return to the emergency room for evaluation. Please follow-up with your primary care provider or specialist as discussed.  °

## 2017-10-08 NOTE — ED Triage Notes (Addendum)
Pt states he started having chest pressure since last Friday after his flight to [place]. Denies N/V. Pt states he was seen here recently for lightheadedness. Pt has also been having SOB and pain under right rib cage.

## 2017-10-12 ENCOUNTER — Encounter (INDEPENDENT_AMBULATORY_CARE_PROVIDER_SITE_OTHER): Payer: Self-pay

## 2017-10-12 ENCOUNTER — Encounter: Payer: Self-pay | Admitting: *Deleted

## 2017-10-12 ENCOUNTER — Ambulatory Visit (INDEPENDENT_AMBULATORY_CARE_PROVIDER_SITE_OTHER): Payer: Self-pay | Admitting: Cardiology

## 2017-10-12 ENCOUNTER — Encounter: Payer: Self-pay | Admitting: Cardiology

## 2017-10-12 VITALS — BP 136/82 | HR 70 | Ht 77.0 in | Wt 354.1 lb

## 2017-10-12 DIAGNOSIS — R079 Chest pain, unspecified: Secondary | ICD-10-CM

## 2017-10-12 DIAGNOSIS — Z720 Tobacco use: Secondary | ICD-10-CM

## 2017-10-12 NOTE — Patient Instructions (Signed)
Medication Instructions:  The current medical regimen is effective;  continue present plan and medications.  Testing/Procedures: Your physician has requested that you have an echocardiogram. Echocardiography is a painless test that uses sound waves to create images of your heart. It provides your doctor with information about the size and shape of your heart and how well your heart's chambers and valves are working. This procedure takes approximately one hour. There are no restrictions for this procedure.  Follow-Up: Follow up as needed after the above testing.  Thank you for choosing [facility]!!     

## 2017-10-12 NOTE — Progress Notes (Signed)
Cardiology Office Note:    Date:  10/12/2017   ID:  Christopher Patton, DOB 17-Sep-1990, MRN [id number]  PCP:  Patient, No Pcp Per  Cardiologist:  No primary care provider on file.   Referring MD: No ref. provider found     History of Present Illness:    Christopher Patton is a 27 y.o. male here for evaluation of chest pain and shortness of breath at the request of Dr. Clarice PolePfeifer.  They have been to the emergency room 5 times in the past year.  Last episode was on 10/08/2017 with complaints of chest pressure sent by his employer.  He will reports that he has chest pressure diffusely for several days.  Started after a trip from [place] with approximately a 3-hour plane ride.  No history of PE or DVT in the past.  One family member has had myocardial infarction but otherwise unremarkable.  Had occasional sharpness on his right side and maybe left. .  No fevers chills nausea vomiting. Get's anxious, hyperventilation.  D-dimer was negative.  ECG was reassuring, normal sinus rhythm without any adverse changes.  Troponin was normal.  Musculoskeletal diagnosis made in ER.  Tob -continues to smoke.  Encourage cessation.  M/F - DM.  GM - 59.   Past Medical History:  Diagnosis Date  . Hypertension     History reviewed. No pertinent surgical history.  Current Medications: No outpatient medications have been marked as taking for the 10/12/17 encounter (Office Visit) with Jake BatheSkains, Mark C, MD.     Allergies:   Patient has no known allergies.   Social History   Socioeconomic History  . Marital status: Single    Spouse name: Not on file  . Number of children: Not on file  . Years of education: Not on file  . Highest education level: Not on file  Occupational History  . Not on file  Social Needs  . Financial resource strain: Not on file  . Food insecurity:    Worry: Not on file    Inability: Not on file  . Transportation needs:    Medical: Not on file    Non-medical: Not on file  Tobacco Use  .  Smoking status: Current Every Day Smoker    Types: Cigars  . Smokeless tobacco: Never Used  . Tobacco comment: Black & Milds 3-4/day  Substance and Sexual Activity  . Alcohol use: Yes    Comment: occassional  . Drug use: No  . Sexual activity: Yes    Birth control/protection: None  Lifestyle  . Physical activity:    Days per week: Not on file    Minutes per session: Not on file  . Stress: Not on file  Relationships  . Social connections:    Talks on phone: Not on file    Gets together: Not on file    Attends religious service: Not on file    Active member of club or organization: Not on file    Attends meetings of clubs or organizations: Not on file    Relationship status: Not on file  Other Topics Concern  . Not on file  Social History Narrative  . Not on file     Family History: The patient's family history as described above in HPI.  ROS:   Please see the history of present illness.     All other systems reviewed and are negative.  EKGs/Labs/Other Studies Reviewed:    The following studies were reviewed today: ER lab work visit ECG  EKG: Prior ECG unremarkable.  Personally viewed  Recent Labs: 09/04/2017: B Natriuretic Peptide 11.0 10/08/2017: BUN 5; Creatinine, Ser 1.03; Hemoglobin 14.7; Platelets 275; Potassium 4.0; Sodium 137  Recent Lipid Panel No results found for: CHOL, TRIG, HDL, CHOLHDL, VLDL, LDLCALC, LDLDIRECT  Physical Exam:    VS:  BP 136/82   Pulse 70   Ht 6\' 5"  (1.956 m)   Wt (!) 354 lb 1.9 oz (160.6 kg)   SpO2 98%   BMI 41.99 kg/m     Wt Readings from Last 3 Encounters:  10/12/17 (!) 354 lb 1.9 oz (160.6 kg)  10/08/17 (!) 360 lb (163.3 kg)  10/05/17 (!) 360 lb (163.3 kg)     GEN:  Well nourished, well developed in no acute distress, obese HEENT: Normal NECK: No JVD; No carotid bruits LYMPHATICS: No lymphadenopathy CARDIAC: RRR, no murmurs, rubs, gallops RESPIRATORY:  Clear to auscultation without rales, wheezing or rhonchi  ABDOMEN:  Soft, non-tender, non-distended MUSCULOSKELETAL:  No edema; No deformity  SKIN: Warm and dry NEUROLOGIC:  Alert and oriented x 3 PSYCHIATRIC:  Normal affect   ASSESSMENT:    1. Chest pain, unspecified type   2. Morbid obesity (HCC)   3. Tobacco abuse    PLAN:    In order of problems listed above:  Atypical chest pain - I agree with prior ER diagnosis of musculoskeletal discomfort.  Fairly pinpoint in origin at times, sharp.  Can be associated with extreme anxiety, hyperventilation which can lead to numbness in his hands bilaterally.  We discussed his anxiety.  I think a good test for him would be to check an echocardiogram to ensure that he has proper structure and function of his heart.  Tobacco use -Continue to encourage cessation.  Utmost importance for him especially at his young age.  Morbid obesity -Continue to encourage weight loss.  Anxiety - We will be talking with the primary physician in July.  May need to help with this.  Discussed breathing techniques.   Medication Adjustments/Labs and Tests Ordered: Current medicines are reviewed at length with the patient today.  Concerns regarding medicines are outlined above.  Orders Placed This Encounter  Procedures  . ECHOCARDIOGRAM COMPLETE   No orders of the defined types were placed in this encounter.   Patient Instructions  Medication Instructions:  The current medical regimen is effective;  continue present plan and medications.  Testing/Procedures: Your physician has requested that you have an echocardiogram. Echocardiography is a painless test that uses sound waves to create images of your heart. It provides your doctor with information about the size and shape of your heart and how well your heart's chambers and valves are working. This procedure takes approximately one hour. There are no restrictions for this procedure.  Follow-Up: Follow up as needed after the above testing.  Thank you for choosing [facility]!!        Signed, Donato Schultz, MD  10/12/2017 3:52 PM     [facility]

## 2017-10-20 ENCOUNTER — Other Ambulatory Visit: Payer: Self-pay

## 2017-10-20 ENCOUNTER — Ambulatory Visit (HOSPITAL_COMMUNITY): Payer: Self-pay | Attending: Cardiovascular Disease

## 2017-10-20 DIAGNOSIS — I119 Hypertensive heart disease without heart failure: Secondary | ICD-10-CM | POA: Insufficient documentation

## 2017-10-20 DIAGNOSIS — F17290 Nicotine dependence, other tobacco product, uncomplicated: Secondary | ICD-10-CM | POA: Insufficient documentation

## 2017-10-20 DIAGNOSIS — R079 Chest pain, unspecified: Secondary | ICD-10-CM | POA: Insufficient documentation

## 2018-04-20 ENCOUNTER — Encounter (HOSPITAL_COMMUNITY): Payer: Self-pay

## 2018-04-20 ENCOUNTER — Emergency Department (HOSPITAL_COMMUNITY)
Admission: EM | Admit: 2018-04-20 | Discharge: 2018-04-21 | Disposition: A | Discharge status: Home / Self Care | Payer: Self-pay | Attending: Emergency Medicine | Admitting: Emergency Medicine

## 2018-04-20 ENCOUNTER — Emergency Department (HOSPITAL_COMMUNITY): Payer: Self-pay

## 2018-04-20 DIAGNOSIS — F17290 Nicotine dependence, other tobacco product, uncomplicated: Secondary | ICD-10-CM | POA: Insufficient documentation

## 2018-04-20 DIAGNOSIS — Z563 Stressful work schedule: Secondary | ICD-10-CM | POA: Insufficient documentation

## 2018-04-20 DIAGNOSIS — Y9289 Other specified places as the place of occurrence of the external cause: Secondary | ICD-10-CM | POA: Insufficient documentation

## 2018-04-20 DIAGNOSIS — R002 Palpitations: Secondary | ICD-10-CM | POA: Insufficient documentation

## 2018-04-20 DIAGNOSIS — R079 Chest pain, unspecified: Secondary | ICD-10-CM | POA: Insufficient documentation

## 2018-04-20 DIAGNOSIS — X500XXA Overexertion from strenuous movement or load, initial encounter: Secondary | ICD-10-CM | POA: Insufficient documentation

## 2018-04-20 DIAGNOSIS — Y9389 Activity, other specified: Secondary | ICD-10-CM | POA: Insufficient documentation

## 2018-04-20 DIAGNOSIS — I10 Essential (primary) hypertension: Secondary | ICD-10-CM | POA: Insufficient documentation

## 2018-04-20 DIAGNOSIS — M25512 Pain in left shoulder: Secondary | ICD-10-CM | POA: Insufficient documentation

## 2018-04-20 LAB — CBC
HCT: 45.9 % (ref 39.0–52.0)
Hemoglobin: 14.9 g/dL (ref 13.0–17.0)
MCH: 27.2 pg (ref 26.0–34.0)
MCHC: 32.5 g/dL (ref 30.0–36.0)
MCV: 83.8 fL (ref 80.0–100.0)
Platelets: 299 10*3/uL (ref 150–400)
RBC: 5.48 MIL/uL (ref 4.22–5.81)
RDW: 14 % (ref 11.5–15.5)
WBC: 7.3 10*3/uL (ref 4.0–10.5)
nRBC: 0 % (ref 0.0–0.2)

## 2018-04-20 LAB — BASIC METABOLIC PANEL
Anion gap: 10 (ref 5–15)
BUN: 10 mg/dL (ref 6–20)
CO2: 27 mmol/L (ref 22–32)
Calcium: 9.3 mg/dL (ref 8.9–10.3)
Chloride: 102 mmol/L (ref 98–111)
Creatinine, Ser: 1.03 mg/dL (ref 0.61–1.24)
GFR calc non Af Amer: >60 mL/min (ref >60)
Glucose, Bld: 134 mg/dL — ABNORMAL HIGH (ref 70–99)
Potassium: 3.9 mmol/L (ref 3.5–5.1)
Sodium: 139 mmol/L (ref 135–145)

## 2018-04-20 LAB — I-STAT TROPONIN, ED: Troponin i, poc: 0.01 ng/mL (ref 0.00–0.08)

## 2018-04-20 NOTE — ED Triage Notes (Signed)
Pt reports his heart was racing at 108bpm that woke him up out of sleep. Pt having chest tightness and left sided shoulder pain.

## 2018-04-21 ENCOUNTER — Telehealth: Payer: Self-pay | Admitting: *Deleted

## 2018-04-21 NOTE — ED Provider Notes (Signed)
[facility] EMERGENCY DEPARTMENT Provider Note   CSN: [id number] Arrival date & time: 04/20/18  2212     History   Chief Complaint Chief Complaint  Patient presents with  . Tachycardia    HPI Christopher Patton is a 27 y.o. male.  27 year old male with a history of hypertension presents to the ED for complaints of palpitations.  He states that he has been experiencing episodes of palpitations waking him from sleep.  States that he feels a sensation in his chest which jolts him awake.  He manually took his heart rate tonight and endorses a rate of 108 bpm.  He has been having similar episodes for almost 1 year.  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  He is asymptomatic at this time and denies taking any medications prior to arrival.  No associated fevers, diaphoresis, vomiting, extremity numbness or paresthesias, leg swelling.  No family history of sudden cardiac death or recent surgeries or hospitalizations.  Denies illicit drug use.  Does smoke black and milds.  Has been under increased stress lately, working 12 hour shifts x 6 this week.     Past Medical History:  Diagnosis Date  . Hypertension     There are no active problems to display for this patient.   History reviewed. No pertinent surgical history.      Home Medications    Prior to Admission medications   Not on File    Family History Family History  Problem Relation Age of Onset  . Diabetes Maternal Uncle   . Kidney failure Maternal Uncle   . Heart attack Maternal Grandmother   . CVA Maternal Grandmother   . Diabetes Maternal Uncle     Social History Social History   Tobacco Use  . Smoking status: Current Every Day Smoker    Types: Cigars  . Smokeless tobacco: Never Used  . Tobacco comment: Black & Milds 3-4/day  Substance Use Topics  .  Alcohol use: Yes    Comment: occassional  . Drug use: No     Allergies   Patient has no known allergies.   Review of Systems Review of Systems Ten systems reviewed and are negative for acute change, except as noted in the HPI.    Physical Exam Updated Vital Signs BP (!) 149/80   Pulse 66   Temp 98.3 F (36.8 C) (Oral)   Resp (!) 23   SpO2 99%   Physical Exam Vitals signs and nursing note reviewed.  Constitutional:      General: He is not in acute distress.    Appearance: He is well-developed. He is not diaphoretic.     Comments: Nontoxic appearing and in NAD  HENT:     Head: Normocephalic and atraumatic.  Eyes:     General: No scleral icterus.    Conjunctiva/sclera: Conjunctivae normal.  Neck:     Musculoskeletal: Normal range of motion.  Cardiovascular:     Rate and Rhythm: Normal rate and regular rhythm.     Pulses: Normal pulses.  Pulmonary:     Effort: Pulmonary effort is normal. No respiratory distress.     Breath sounds: No wheezing.     Comments: Respirations even and unlabored Musculoskeletal: Normal range of motion.     Comments: No BLE edema  Skin:    General: Skin is warm and dry.  Coloration: Skin is not pale.     Findings: No erythema or rash.  Neurological:     Mental Status: He is alert and oriented to person, place, and time.  Psychiatric:        Behavior: Behavior normal.      ED Treatments / Results  Labs (all labs ordered are listed, but only abnormal results are displayed) Labs Reviewed  BASIC METABOLIC PANEL - Abnormal; Notable for the following components:      Result Value   Glucose, Bld 134 (*)    All other components within normal limits  CBC  I-STAT TROPONIN, ED    EKG EKG Interpretation  Date/Time:  Tuesday April 20 2018 22:26:00 EST Ventricular Rate:  98 PR Interval:  172 QRS Duration: 84 QT Interval:  348 QTC Calculation: 444 R Axis:   61 Text Interpretation:  Normal sinus rhythm Cannot rule out  Anterior infarct , age undetermined Abnormal ECG Confirmed by Ross MarcusHorton, Courtney ([id number]) on 04/21/2018 12:36:33 AM   Radiology Dg Chest 2 View  Result Date: 04/20/2018 CLINICAL DATA:  Awoke with chest pain and palpitations. EXAM: CHEST - 2 VIEW COMPARISON:  Radiograph 10/08/2017 FINDINGS: The cardiomediastinal contours are normal. The lungs are clear. Pulmonary vasculature is normal. No consolidation, pleural effusion, or pneumothorax. No acute osseous abnormalities are seen. IMPRESSION: No acute pulmonary process. Electronically Signed   By: Narda RutherfordMelanie  Sanford M.D.   On: 04/20/2018 22:48    Procedures Procedures (including critical care time)  Medications Ordered in ED Medications - No data to display   Initial Impression / Assessment and Plan / ED Course  I have reviewed the triage vital signs and the nursing notes.  Pertinent labs & imaging results that were available during my care of the patient were reviewed by me and considered in my medical decision making (see chart for details).     Patient presents to the emergency department for evaluation of palpitations.  Has had episodes of palpitations intermittently for almost 1 year.  Low suspicion for emergent cardiac etiology given reassuring workup today.  EKG is nonischemic and troponin negative.  Patient has a heart score of 1 consistent with low risk of acute coronary event.  Chest x-ray without evidence of mediastinal widening to suggest dissection.  No pneumothorax, pneumonia, pleural effusion.  Pulmonary embolus further considered; however, patient without tachypnea, dyspnea, hypoxia and symptoms seem atypical for this.  Suspect palpitations to be secondary to stress reaction.  Chronicity of symptoms makes emergent process less likely.  He has been encouraged to follow-up with a primary care doctor.  We will also give referral to cardiology should patient desire outpatient follow-up with a specialist.  Return precautions discussed and  provided. Patient discharged in stable condition with no unaddressed concerns.   Final Clinical Impressions(s) / ED Diagnoses   Final diagnoses:  Palpitations    ED Discharge Orders    None       Antony MaduraHumes, Nithin Demeo, PA-C 04/21/18 [id number]    Shon BatonHorton, Courtney F, MD 04/21/18 [phone]

## 2018-04-21 NOTE — Telephone Encounter (Signed)
EDCM consulted to assist pt with insurance information.  EDCM called pt and advised to [facility] to obtain [facility] financial counselor appointment for insurance options of uninsured.  Pt appreciative of information.

## 2018-04-21 NOTE — Discharge Instructions (Signed)
Your work-up in the emergency department was reassuring and did not reveal a concerning cause of your symptoms.  We advise follow-up with a primary care doctor.  You may see a cardiologist, if desired.

## 2018-05-02 ENCOUNTER — Emergency Department (HOSPITAL_COMMUNITY)
Admission: EM | Admit: 2018-05-02 | Discharge: 2018-05-02 | Disposition: A | Discharge status: Home / Self Care | Payer: Self-pay | Attending: Emergency Medicine | Admitting: Emergency Medicine

## 2018-05-02 ENCOUNTER — Other Ambulatory Visit: Payer: Self-pay

## 2018-05-02 ENCOUNTER — Encounter (HOSPITAL_COMMUNITY): Payer: Self-pay | Admitting: Emergency Medicine

## 2018-05-02 ENCOUNTER — Emergency Department (HOSPITAL_COMMUNITY): Payer: Self-pay

## 2018-05-02 DIAGNOSIS — R002 Palpitations: Secondary | ICD-10-CM | POA: Insufficient documentation

## 2018-05-02 DIAGNOSIS — R0602 Shortness of breath: Secondary | ICD-10-CM | POA: Insufficient documentation

## 2018-05-02 DIAGNOSIS — I10 Essential (primary) hypertension: Secondary | ICD-10-CM | POA: Insufficient documentation

## 2018-05-02 DIAGNOSIS — F17210 Nicotine dependence, cigarettes, uncomplicated: Secondary | ICD-10-CM | POA: Insufficient documentation

## 2018-05-02 LAB — CBC
HCT: 45.5 % (ref 39.0–52.0)
Hemoglobin: 14.8 g/dL (ref 13.0–17.0)
MCH: 26.7 pg (ref 26.0–34.0)
MCHC: 32.5 g/dL (ref 30.0–36.0)
MCV: 82 fL (ref 80.0–100.0)
Platelets: 264 10*3/uL (ref 150–400)
RBC: 5.55 MIL/uL (ref 4.22–5.81)
RDW: 13.7 % (ref 11.5–15.5)
WBC: 6 10*3/uL (ref 4.0–10.5)
nRBC: 0 % (ref 0.0–0.2)

## 2018-05-02 LAB — BASIC METABOLIC PANEL
Anion gap: 10 (ref 5–15)
BUN: 8 mg/dL (ref 6–20)
CO2: 24 mmol/L (ref 22–32)
Calcium: 9 mg/dL (ref 8.9–10.3)
Chloride: 104 mmol/L (ref 98–111)
Creatinine, Ser: 1 mg/dL (ref 0.61–1.24)
GFR calc Af Amer: >60 mL/min (ref >60)
GFR calc non Af Amer: >60 mL/min (ref >60)
Glucose, Bld: 121 mg/dL — ABNORMAL HIGH (ref 70–99)
Potassium: 3.5 mmol/L (ref 3.5–5.1)
Sodium: 138 mmol/L (ref 135–145)

## 2018-05-02 LAB — I-STAT TROPONIN, ED
TROPONIN I, POC: 0.01 ng/mL (ref 0.00–0.08)
Troponin i, poc: 0 ng/mL (ref 0.00–0.08)

## 2018-05-02 NOTE — ED Notes (Signed)
Patient verbalizes understanding of discharge instructions. Opportunity for questioning and answers were provided. Armband removed by staff, pt discharged from ED.  

## 2018-05-02 NOTE — ED Provider Notes (Signed)
[facility]ENC[facility] Provider Note   CSN: [id number] Arrival date & time: 05/02/18  1814     History   Chief Complaint Chief Complaint  Patient presents with  . Shortness of Breath  . Tachycardia    HPI Christopher Patton is a 27 y.o. male.  27yo M w/ h/o HTN who p/w heart palpitations, shortness of breath, and lightheadedness.  Patient states that he ate dinner tonight and then laid down and fell asleep.  Around 5:30 PM, he woke up feeling like his heart was racing and had associated shortness of breath, lightheadedness, and his body felt heavy.  His symptoms lasted approximately an hour and then resolved.  He had some associated chest tightness.  He has had episodes like this previously.  He states he drank 1 beer earlier today but denies any heavy drinking.  He denies any drug use.  No recent travel or history of blood clots.  The history is provided by the patient.  Shortness of Breath     Past Medical History:  Diagnosis Date  . Hypertension     There are no active problems to display for this patient.   History reviewed. No pertinent surgical history.      Home Medications    Prior to Admission medications   Not on File    Family History Family History  Problem Relation Age of Onset  . Diabetes Maternal Uncle   . Kidney failure Maternal Uncle   . Heart attack Maternal Grandmother   . CVA Maternal Grandmother   . Diabetes Maternal Uncle     Social History Social History   Tobacco Use  . Smoking status: Current Every Day Smoker    Types: Cigars  . Smokeless tobacco: Never Used  . Tobacco comment: Black & Milds 3-4/day  Substance Use Topics  . Alcohol use: Yes    Comment: occassional  . Drug use: No     Allergies   Patient has no known allergies.   Review of Systems Review of Systems  Respiratory: Positive for shortness of breath.    All other systems reviewed and are negative except that which was mentioned  in HPI  Physical Exam Updated Vital Signs BP 138/85   Pulse 65   Temp 98.3 F (36.8 C) (Oral)   Resp 19   Ht 6\' 5"  (1.956 m)   Wt (!) 158.8 kg   SpO2 98%   BMI 41.50 kg/m   Physical Exam Vitals signs and nursing note reviewed.  Constitutional:      General: He is not in acute distress.    Appearance: He is well-developed.     Comments: Talking on phone, comfortable  HENT:     Head: Normocephalic and atraumatic.  Eyes:     Conjunctiva/sclera: Conjunctivae normal.  Neck:     Musculoskeletal: Neck supple.  Cardiovascular:     Rate and Rhythm: Normal rate and regular rhythm.     Heart sounds: Normal heart sounds. No murmur.  Pulmonary:     Effort: Pulmonary effort is normal.     Breath sounds: Normal breath sounds.  Abdominal:     General: Bowel sounds are normal. There is no distension.     Palpations: Abdomen is soft.     Tenderness: There is no abdominal tenderness.  Skin:    General: Skin is warm and dry.  Neurological:     Mental Status: He is alert and oriented to person, place, and time.  Comments: Fluent speech  Psychiatric:        Judgment: Judgment normal.      ED Treatments / Results  Labs (all labs ordered are listed, but only abnormal results are displayed) Labs Reviewed  BASIC METABOLIC PANEL - Abnormal; Notable for the following components:      Result Value   Glucose, Bld 121 (*)    All other components within normal limits  CBC  I-STAT TROPONIN, ED  I-STAT TROPONIN, ED    EKG EKG Interpretation  Date/Time:  Sunday May 02 2018 18:25:11 EST Ventricular Rate:  86 PR Interval:  182 QRS Duration: 82 QT Interval:  358 QTC Calculation: 428 R Axis:   61 Text Interpretation:  Normal sinus rhythm Normal ECG No significant change since last tracing Confirmed by Frederick PeersLittle, Jahmani Staup [phone]([id number]) on 05/02/2018 8:23:46 PM   Radiology Dg Chest 2 View  Result Date: 05/02/2018 CLINICAL DATA:  Tachycardia, dyspnea EXAM: CHEST - 2 VIEW COMPARISON:   04/20/2018 chest radiograph. FINDINGS: Stable cardiomediastinal silhouette with normal heart size. No pneumothorax. No pleural effusion. Lungs appear clear, with no acute consolidative airspace disease and no pulmonary edema. IMPRESSION: No active cardiopulmonary disease. Electronically Signed   By: Delbert PhenixJason A Poff M.D.   On: 05/02/2018 20:36    Procedures Procedures (including critical care time)  Medications Ordered in ED Medications - No data to display   Initial Impression / Assessment and Plan / ED Course  I have reviewed the triage vital signs and the nursing notes.  Pertinent labs & imaging results that were available during my care of the patient were reviewed by me and considered in my medical decision making (see chart for details).    Optimal on exam, normal heart rate, very mild hypertension.  EKG without arrhythmia or ischemic changes. PERC negative thus PE very unlikely. Labs reassuring with normal serial trops.  Reviewed his chart which shows many previous visits for similar symptoms.  He has no evidence of arrhythmia here and his symptoms do not suggest ACS.  I recommend that he follow-up with PCP for further outpatient evaluation.  Return precautions reviewed.  He voiced understanding. Final Clinical Impressions(s) / ED Diagnoses   Final diagnoses:  Palpitations  Shortness of breath    ED Discharge Orders    None       Krishauna Schatzman, Ambrose Finlandachel Morgan, MD 05/03/18 0005

## 2018-05-02 NOTE — ED Triage Notes (Signed)
C/o SOB, lightheadedness, fast HR, and body feeling heavy all over since 5:30pm.  Denies pain.

## 2018-09-26 ENCOUNTER — Emergency Department (HOSPITAL_COMMUNITY)
Admission: EM | Admit: 2018-09-26 | Discharge: 2018-09-27 | Disposition: A | Discharge status: Home / Self Care | Payer: Self-pay | Attending: Emergency Medicine | Admitting: Emergency Medicine

## 2018-09-26 ENCOUNTER — Encounter (HOSPITAL_COMMUNITY): Payer: Self-pay | Admitting: Emergency Medicine

## 2018-09-26 ENCOUNTER — Emergency Department (HOSPITAL_COMMUNITY): Payer: Self-pay

## 2018-09-26 DIAGNOSIS — I10 Essential (primary) hypertension: Secondary | ICD-10-CM | POA: Insufficient documentation

## 2018-09-26 DIAGNOSIS — R42 Dizziness and giddiness: Secondary | ICD-10-CM | POA: Insufficient documentation

## 2018-09-26 DIAGNOSIS — R0602 Shortness of breath: Secondary | ICD-10-CM | POA: Insufficient documentation

## 2018-09-26 DIAGNOSIS — Z6827 Body mass index (BMI) 27.0-27.9, adult: Secondary | ICD-10-CM | POA: Insufficient documentation

## 2018-09-26 DIAGNOSIS — F17290 Nicotine dependence, other tobacco product, uncomplicated: Secondary | ICD-10-CM | POA: Insufficient documentation

## 2018-09-26 DIAGNOSIS — R0789 Other chest pain: Secondary | ICD-10-CM | POA: Insufficient documentation

## 2018-09-26 DIAGNOSIS — R002 Palpitations: Secondary | ICD-10-CM | POA: Insufficient documentation

## 2018-09-26 LAB — TROPONIN I: Troponin I: <0.03 ng/mL (ref <0.03)

## 2018-09-26 LAB — CBC
HCT: 43.8 % (ref 39.0–52.0)
Hemoglobin: 14.4 g/dL (ref 13.0–17.0)
MCH: 27.8 pg (ref 26.0–34.0)
MCHC: 32.9 g/dL (ref 30.0–36.0)
MCV: 84.6 fL (ref 80.0–100.0)
Platelets: 241 10*3/uL (ref 150–400)
RBC: 5.18 MIL/uL (ref 4.22–5.81)
RDW: 14.2 % (ref 11.5–15.5)
WBC: 6.5 10*3/uL (ref 4.0–10.5)
nRBC: 0 % (ref 0.0–0.2)

## 2018-09-26 LAB — BASIC METABOLIC PANEL
Anion gap: 8 (ref 5–15)
BUN: 11 mg/dL (ref 6–20)
CO2: 24 mmol/L (ref 22–32)
Calcium: 8.6 mg/dL — ABNORMAL LOW (ref 8.9–10.3)
Chloride: 105 mmol/L (ref 98–111)
Creatinine, Ser: 1.03 mg/dL (ref 0.61–1.24)
GFR calc Af Amer: >60 mL/min (ref >60)
GFR calc non Af Amer: >60 mL/min (ref >60)
Glucose, Bld: 102 mg/dL — ABNORMAL HIGH (ref 70–99)
Potassium: 3.8 mmol/L (ref 3.5–5.1)
Sodium: 137 mmol/L (ref 135–145)

## 2018-09-26 MED ORDER — SODIUM CHLORIDE 0.9% FLUSH: 3.0000 mL | Freq: Once | INTRAVENOUS | Status: DC

## 2018-09-26 NOTE — ED Triage Notes (Signed)
Pt in POV, reports CP onset 1hr PTA. Also reports SOB, dizziness. No cardiac hx. Pt in NAD.

## 2018-09-27 NOTE — Discharge Instructions (Addendum)
Thank you for allowing me to care for you today in the Emergency Department.   Call to schedule a follow up appointment with Dr. Anne Fu to discuss a holter monitor.  You can try taking 600 mg of ibuprofen with food once every 6 hours for pain.  Return to the emergency department if you develop chest pain that becomes constant, severe, respiratory distress, if your fingers or lips turn blue, if you pass out, develop persistent vomiting, or other new, concerning symptoms.

## 2018-09-27 NOTE — ED Notes (Signed)
Provider at bedside

## 2018-09-27 NOTE — ED Provider Notes (Signed)
[facility] EMERGENCY DEPARTMENT Provider Note   CSN: [id number] Arrival date & time: 09/26/18  2119    History   Chief Complaint Chief Complaint  Patient presents with  . Chest Pain    HPI Christopher Patton is a 28 y.o. male with a history of hypertension who presents to the emergency department with a chief complaint of chest pain.  The patient reports that he had an episode of chest pain, characterized as tightness, in the middle of his chest that began earlier tonight while watching TV.  He reports that he felt somewhat lightheaded, palpitations, and mildly short of breath.  He reports that the episode resolved after approximately 5 minutes.  He reports that he has had 3 similar episodes tonight.  No correlation with eating, exertion.  Pain is not pleuritic in nature.  Pain is not worse with movement.  No other known aggravating or alleviating factors.  Patient states "I'm not having pain right now. I just wanted to make sure I wasn't going to die tonight."   He reports a longstanding history of similar episodes.  He reports that sometimes he will have similar episodes where he has tingling and numbness in his bilateral hands.He states that when he previously had similar pain and was evaluated by Dr. Anne Fu, cardiology, he was told the symptoms might be related to the muscles in his chest wall.  No treatment prior to arrival.  Per chart review, Dr. Anne Fu also recommends further evaluation for anxiety after the patient had a normal echo and cardiac work-up in the office.  He denies fever, chills, cough, nausea, vomiting, diarrhea, numbness, syncope, weakness, neck or arm pain, abdominal pain, or back pain.  No history of asthma.  He reports that he smokes Black and milds occasionally.  He reports that he drinks alcohol socially, but no alcohol use today.  No other IV or recreational drug use.     The history is provided by the patient. No language interpreter was used.     Past Medical History:  Diagnosis Date  . Hypertension     There are no active problems to display for this patient.   History reviewed. No pertinent surgical history.      Home Medications    Prior to Admission medications   Not on File    Family History Family History  Problem Relation Age of Onset  . Diabetes Maternal Uncle   . Kidney failure Maternal Uncle   . Heart attack Maternal Grandmother   . CVA Maternal Grandmother   . Diabetes Maternal Uncle     Social History Social History   Tobacco Use  . Smoking status: Current Every Day Smoker    Types: Cigars  . Smokeless tobacco: Never Used  . Tobacco comment: Black & Milds 3-4/day  Substance Use Topics  . Alcohol use: Yes    Comment: Socially   . Drug use: No     Allergies   Patient has no known allergies.   Review of Systems Review of Systems  Constitutional: Negative for appetite change, chills and fever.  Respiratory: Positive for chest tightness and shortness of breath.   Cardiovascular: Positive for chest pain. Negative for palpitations and leg swelling.  Gastrointestinal: Negative for abdominal pain, diarrhea, nausea and vomiting.  Genitourinary: Negative for dysuria.  Musculoskeletal: Negative for back pain.  Skin: Negative for rash.  Allergic/Immunologic: Negative for immunocompromised state.  Neurological: Negative for dizziness, weakness, numbness and headaches.  Psychiatric/Behavioral: Negative for confusion.  Physical Exam Updated Vital Signs BP 124/80   Pulse (!) 52   Temp 98.3 F (36.8 C)   Resp 20   Ht 6\' 5"  (1.956 m)   Wt 104.3 kg   SpO2 96%   BMI 27.27 kg/m   Physical Exam Vitals signs and nursing note reviewed.  Constitutional:      General: He is not in acute distress.    Appearance: He is well-developed. He is obese. He is not ill-appearing, toxic-appearing or diaphoretic.     Comments: Morbidly obese body habitus.   HENT:     Head: Normocephalic.  Eyes:      Conjunctiva/sclera: Conjunctivae normal.  Neck:     Musculoskeletal: Neck supple.  Cardiovascular:     Rate and Rhythm: Normal rate and regular rhythm.     Pulses: Normal pulses.     Heart sounds: Normal heart sounds. No murmur. No friction rub. No gallop.      Comments: Heart is regular rate and rhythm without murmurs rubs or gallops.  Pulmonary:     Effort: Pulmonary effort is normal. No respiratory distress.     Breath sounds: No stridor. No wheezing, rhonchi or rales.     Comments: Lungs are clear to auscultation bilaterally.  No adventitious breath sounds.  Good air movement with inspiration and expiration.  No tenderness to the chest wall.  No focal tenderness of the bilateral ribs.  No crepitus or step-offs. Chest:     Chest wall: No tenderness.  Abdominal:     General: There is no distension.     Palpations: Abdomen is soft. There is no mass.     Tenderness: There is no abdominal tenderness. There is no right CVA tenderness, left CVA tenderness, guarding or rebound.     Hernia: No hernia is present.     Comments: Abdomen is obese, but soft and nontender.  Skin:    General: Skin is warm and dry.  Neurological:     Mental Status: He is alert.  Psychiatric:        Behavior: Behavior normal.      ED Treatments / Results  Labs (all labs ordered are listed, but only abnormal results are displayed) Labs Reviewed  BASIC METABOLIC PANEL - Abnormal; Notable for the following components:      Result Value   Glucose, Bld 102 (*)    Calcium 8.6 (*)    All other components within normal limits  CBC  TROPONIN I    EKG EKG Interpretation  Date/Time:  Sunday Sep 26 2018 21:26:06 EDT Ventricular Rate:  64 PR Interval:  194 QRS Duration: 92 QT Interval:  406 QTC Calculation: 418 R Axis:   67 Text Interpretation:  Normal sinus rhythm Nonspecific ST and T wave abnormality Abnormal ECG When compared with ECG of 05/02/2018, No significant change was found Confirmed by Dione BoozeGlick,  David ([id number]) on 09/26/2018 11:53:14 PM   Radiology Dg Chest 2 View  Result Date: 09/26/2018 CLINICAL DATA:  Chest pain EXAM: CHEST - 2 VIEW COMPARISON:  05/02/2018 FINDINGS: The heart size and mediastinal contours are within normal limits. Both lungs are clear. The visualized skeletal structures are unremarkable. IMPRESSION: No active cardiopulmonary disease. Electronically Signed   By: Katherine Mantlehristopher  Green M.D.   On: 09/26/2018 21:48    Procedures Procedures (including critical care time)  Medications Ordered in ED Medications  sodium chloride flush (NS) 0.9 % injection 3 mL (has no administration in time range)     Initial Impression / Assessment  and Plan / ED Course  I have reviewed the triage vital signs and the nursing notes.  Pertinent labs & imaging results that were available during my care of the patient were reviewed by me and considered in my medical decision making (see chart for details).        28 year old male with history of hypertension presenting with intermittent episodes of chest pain accompanied by mild shortness of breath and lightheadedness.  After reviewing the patient's medical record, it appears the patient has had 5 previous ER visits for similar symptoms.  He was also seen and evaluated by Dr. Anne Fu with cardiology in June 2019.  He is symptom-free at this time.  States he just wanted to come to the ER to make sure was going to die tonight."  On initial evaluation, patient is sleeping comfortably with equal and unlabored respirations.  EKG unchanged from previous.  Troponin is negative.  Labs are reassuring.  Given intermittent nature of pain, delta troponin is not indicated.  He is PERC negative.  Heart score is low risk.  Low suspicion for aortic dissection, tension pneumothorax, pericarditis or myocarditis, esophageal rupture, PE, or ACS.  The patient was offered Toradol in the ER, which he declines.  Also have a low suspicion for musculoskeletal etiology at  this time as pain is not reproducible.  At this time, the patient is hemodynamically stable and in no acute distress.  Recommended outpatient follow-up with PCP or cardiology possibly for a Holter monitor if symptoms persist.  Final Clinical Impressions(s) / ED Diagnoses   Final diagnoses:  Palpitation    ED Discharge Orders    None       Barkley Boards, PA-C 09/27/18 0542    Dione Booze, MD 09/27/18 [phone]

## 2018-09-27 NOTE — ED Notes (Signed)
Pt verbalized understanding of d/c instructions, medications and follow up. Pt refused WC to waiting area at this time. Pt ambulated to WR with steady gait.

## 2018-09-27 NOTE — ED Notes (Signed)
Pt resting on stretcher with eyes closed, RR even and unlabored, NAD 

## 2018-10-03 ENCOUNTER — Encounter (HOSPITAL_COMMUNITY): Payer: Self-pay

## 2018-10-03 ENCOUNTER — Ambulatory Visit (HOSPITAL_COMMUNITY)
Admission: EM | Admit: 2018-10-03 | Discharge: 2018-10-03 | Disposition: A | Discharge status: Home / Self Care | Payer: Worker's Compensation | Attending: Family Medicine | Admitting: Family Medicine

## 2018-10-03 ENCOUNTER — Other Ambulatory Visit: Payer: Self-pay

## 2018-10-03 DIAGNOSIS — H00014 Hordeolum externum left upper eyelid: Secondary | ICD-10-CM

## 2018-10-03 DIAGNOSIS — H1032 Unspecified acute conjunctivitis, left eye: Secondary | ICD-10-CM

## 2018-10-03 MED ORDER — DOXYCYCLINE HYCLATE 100 MG PO TABS: 100.0000 mg | ORAL_TABLET | Freq: Two times a day (BID) | ORAL | 0 refills | Status: AC

## 2018-10-03 MED ORDER — TOBRAMYCIN 0.3 % OP SOLN: 1.0000 [drp] | Freq: Four times a day (QID) | OPHTHALMIC | 0 refills | Status: AC

## 2018-10-03 NOTE — ED Triage Notes (Signed)
Pt presents with left eye swelling and irritation X 2 days.

## 2018-10-03 NOTE — ED Provider Notes (Addendum)
[facility]    CSN: [id number] Arrival date & time: 10/03/18  1626     History   Chief Complaint Chief Complaint  Patient presents with  . Eye Problem    Left    HPI Christopher Patton is a 28 y.o. male.   28 yo established male who presents with  left eye swelling and irritation X 2 days.  He had gotten some dirt in the left eye from a pallet at work on Thursday.     Past Medical History:  Diagnosis Date  . Hypertension     There are no active problems to display for this patient.   History reviewed. No pertinent surgical history.     Home Medications    Prior to Admission medications   Medication Sig Start Date End Date Taking? Authorizing Provider  doxycycline (VIBRA-TABS) 100 MG tablet Take 1 tablet (100 mg total) by mouth 2 (two) times daily. 10/03/18   Elvina Sidle, MD  tobramycin (TOBREX) 0.3 % ophthalmic solution Place 1 drop into the left eye every 6 (six) hours. 10/03/18   Elvina Sidle, MD    Family History Family History  Problem Relation Age of Onset  . Diabetes Maternal Uncle   . Kidney failure Maternal Uncle   . Heart attack Maternal Grandmother   . CVA Maternal Grandmother   . Diabetes Maternal Uncle     Social History Social History   Tobacco Use  . Smoking status: Current Every Day Smoker    Types: Cigars  . Smokeless tobacco: Never Used  . Tobacco comment: Black & Milds 3-4/day  Substance Use Topics  . Alcohol use: Yes    Comment: Socially   . Drug use: No     Allergies   Patient has no known allergies.   Review of Systems Review of Systems   Physical Exam Triage Vital Signs ED Triage Vitals  Enc Vitals Group     BP 10/03/18 1703 (!) 151/85     Pulse Rate 10/03/18 1703 88     Resp 10/03/18 1703 18     Temp 10/03/18 1703 98.8 F (37.1 C)     Temp Source 10/03/18 1703 Oral     SpO2 10/03/18 1703 98 %     Weight --      Height --      Head Circumference --      Peak Flow --      Pain Score  10/03/18 1704 6     Pain Loc --      Pain Edu? --      Excl. in GC? --    No data found.  Updated Vital Signs BP (!) 151/85 (BP Location: Right Arm)   Pulse 88   Temp 98.8 F (37.1 C) (Oral)   Resp 18   SpO2 98%    Physical Exam Vitals signs reviewed.  Constitutional:      Appearance: Normal appearance.  HENT:     Head: Normocephalic.  Eyes:     General:        Left eye: Discharge present.    Comments: Reddened left conjunctiva Swollen left upper lid  Neck:     Musculoskeletal: Normal range of motion and neck supple.  Pulmonary:     Effort: Pulmonary effort is normal.  Musculoskeletal: Normal range of motion.  Neurological:     General: No focal deficit present.     Mental Status: He is alert.      UC Treatments / Results  Labs (all labs ordered are listed, but only abnormal results are displayed) Labs Reviewed - No data to display  EKG None  Radiology No results found.  Procedures Procedures (including critical care time)  Medications Ordered in UC Medications - No data to display  Initial Impression / Assessment and Plan / UC Course  I have reviewed the triage vital signs and the nursing notes.  Pertinent labs & imaging results that were available during my care of the patient were reviewed by me and considered in my medical decision making (see chart for details).    Final Clinical Impressions(s) / UC Diagnoses   Final diagnoses:  Acute bacterial conjunctivitis of left eye  Hordeolum externum of left upper eyelid     Discharge Instructions     Apply warm moist compresses to the left upper lid at least 4 times a day    ED Prescriptions    Medication Sig Dispense Auth. Provider   doxycycline (VIBRA-TABS) 100 MG tablet Take 1 tablet (100 mg total) by mouth 2 (two) times daily. 14 tablet Elvina Sidle, MD   tobramycin (TOBREX) 0.3 % ophthalmic solution Place 1 drop into the left eye every 6 (six) hours. 5 mL Elvina Sidle, MD      Controlled Substance Prescriptions [name] Controlled Substance Registry consulted? Not Applicable   Elvina Sidle, MD 10/03/18 [name]    Elvina Sidle, MD 10/03/18 [name]

## 2018-10-03 NOTE — Discharge Instructions (Addendum)
Apply warm moist compresses to the left upper lid at least 4 times a day

## 2018-11-13 ENCOUNTER — Emergency Department (HOSPITAL_COMMUNITY): Payer: Self-pay

## 2018-11-13 ENCOUNTER — Emergency Department (HOSPITAL_COMMUNITY)
Admission: EM | Admit: 2018-11-13 | Discharge: 2018-11-13 | Disposition: A | Discharge status: Home / Self Care | Payer: Self-pay | Attending: Emergency Medicine | Admitting: Emergency Medicine

## 2018-11-13 ENCOUNTER — Other Ambulatory Visit: Payer: Self-pay

## 2018-11-13 DIAGNOSIS — R002 Palpitations: Secondary | ICD-10-CM | POA: Insufficient documentation

## 2018-11-13 DIAGNOSIS — R0789 Other chest pain: Secondary | ICD-10-CM | POA: Insufficient documentation

## 2018-11-13 DIAGNOSIS — F17290 Nicotine dependence, other tobacco product, uncomplicated: Secondary | ICD-10-CM | POA: Insufficient documentation

## 2018-11-13 DIAGNOSIS — I10 Essential (primary) hypertension: Secondary | ICD-10-CM | POA: Insufficient documentation

## 2018-11-13 DIAGNOSIS — R0602 Shortness of breath: Secondary | ICD-10-CM | POA: Insufficient documentation

## 2018-11-13 LAB — BASIC METABOLIC PANEL
Anion gap: 11 (ref 5–15)
BUN: 9 mg/dL (ref 6–20)
CO2: 21 mmol/L — ABNORMAL LOW (ref 22–32)
Calcium: 8.5 mg/dL — ABNORMAL LOW (ref 8.9–10.3)
Chloride: 105 mmol/L (ref 98–111)
Creatinine, Ser: 1.03 mg/dL (ref 0.61–1.24)
GFR calc Af Amer: >60 mL/min (ref >60)
GFR calc non Af Amer: >60 mL/min (ref >60)
Glucose, Bld: 113 mg/dL — ABNORMAL HIGH (ref 70–99)
Potassium: 3.5 mmol/L (ref 3.5–5.1)
Sodium: 137 mmol/L (ref 135–145)

## 2018-11-13 LAB — CBC
HCT: 46.1 % (ref 39.0–52.0)
Hemoglobin: 14.9 g/dL (ref 13.0–17.0)
MCH: 27.5 pg (ref 26.0–34.0)
MCHC: 32.3 g/dL (ref 30.0–36.0)
MCV: 85.1 fL (ref 80.0–100.0)
Platelets: 236 10*3/uL (ref 150–400)
RBC: 5.42 MIL/uL (ref 4.22–5.81)
RDW: 14.2 % (ref 11.5–15.5)
WBC: 6.5 10*3/uL (ref 4.0–10.5)
nRBC: 0 % (ref 0.0–0.2)

## 2018-11-13 LAB — TROPONIN I (HIGH SENSITIVITY): Troponin I (High Sensitivity): 5 ng/L (ref <18)

## 2018-11-13 MED ORDER — SODIUM CHLORIDE 0.9% FLUSH: 3.0000 mL | Freq: Once | INTRAVENOUS | Status: DC

## 2018-11-13 NOTE — ED Provider Notes (Signed)
[facility]   CSN: [id number] Arrival date & time: 11/13/18  1933     History   Chief Complaint Chief Complaint  Patient presents with  . Chest Pain    HPI Christopher Patton is a 28 y.o. male.     HPI  28 year old male, with a PMH of HTN, presents with episode of chest pain.  Patient states he was laying in bed and when he turned over he had a sharp pinpoint pain of the left side of his chest, nonradiating.  He states he became anxious and felt like his heart rate increased and he was short of breath.  He states feeling palpitations and shortness of breath for only seconds.  He does note some mild discomfort remaining in the pinpoint area of the left chest.  Patient states a long history of this pain and intermittent episodes of this pain.  States symptoms started after he injured himself months ago.  He states he does think he reinjured the area today when he was lifting heavy groceries.  He denies any recent travel/trauma/immobilization, peripheral edema.  Past Medical History:  Diagnosis Date  . Hypertension     There are no active problems to display for this patient.   No past surgical history on file.      Home Medications    Prior to Admission medications   Medication Sig Start Date End Date Taking? Authorizing Provider  doxycycline (VIBRA-TABS) 100 MG tablet Take 1 tablet (100 mg total) by mouth 2 (two) times daily. 10/03/18   Elvina SidleLauenstein, Kurt, MD  tobramycin (TOBREX) 0.3 % ophthalmic solution Place 1 drop into the left eye every 6 (six) hours. 10/03/18   Elvina SidleLauenstein, Kurt, MD    Family History Family History  Problem Relation Age of Onset  . Diabetes Maternal Uncle   . Kidney failure Maternal Uncle   . Heart attack Maternal Grandmother   . CVA Maternal Grandmother   . Diabetes Maternal Uncle     Social History Social History   Tobacco Use  . Smoking status: Current Every Day Smoker    Types: Cigars   . Smokeless tobacco: Never Used  . Tobacco comment: Black & Milds 3-4/day  Substance Use Topics  . Alcohol use: Yes    Comment: Socially   . Drug use: No     Allergies   Patient has no known allergies.   Review of Systems Review of Systems  Constitutional: Negative for chills and fever.  Respiratory: Positive for shortness of breath.   Cardiovascular: Positive for chest pain and palpitations.  Gastrointestinal: Negative for abdominal pain, nausea and vomiting.     Physical Exam Updated Vital Signs BP (!) 141/87   Pulse 90   Temp 98.3 F (36.8 C) (Oral)   Resp 18   SpO2 100%   Physical Exam Vitals signs and nursing note reviewed.  Constitutional:      Appearance: He is well-developed.  HENT:     Head: Normocephalic and atraumatic.  Eyes:     Conjunctiva/sclera: Conjunctivae normal.  Neck:     Musculoskeletal: Neck supple.  Cardiovascular:     Rate and Rhythm: Normal rate and regular rhythm.     Heart sounds: Normal heart sounds. No murmur.  Pulmonary:     Effort: Pulmonary effort is normal. No respiratory distress.     Breath sounds: Normal breath sounds. No wheezing or rales.  Chest:     Chest wall: No deformity or crepitus.  Abdominal:     General: Bowel sounds are normal. There is no distension.     Palpations: Abdomen is soft.     Tenderness: There is no abdominal tenderness.  Musculoskeletal: Normal range of motion.        General: No tenderness or deformity.  Skin:    General: Skin is warm and dry.     Findings: No erythema or rash.  Neurological:     Mental Status: He is alert and oriented to person, place, and time.  Psychiatric:        Behavior: Behavior normal.      ED Treatments / Results  Labs (all labs ordered are listed, but only abnormal results are displayed) Labs Reviewed  BASIC METABOLIC PANEL - Abnormal; Notable for the following components:      Result Value   CO2 21 (*)    Glucose, Bld 113 (*)    Calcium 8.5 (*)     All other components within normal limits  CBC  TROPONIN I (HIGH SENSITIVITY)  TROPONIN I (HIGH SENSITIVITY)    EKG None  Radiology Dg Chest 2 View  Result Date: 11/13/2018 CLINICAL DATA:  Chest pain. EXAM: CHEST - 2 VIEW COMPARISON:  Sep 26, 2018 FINDINGS: The heart size and mediastinal contours are within normal limits. Both lungs are clear. The visualized skeletal structures are unremarkable. IMPRESSION: No active cardiopulmonary disease. Electronically Signed   By: Dorise Bullion III M.D   On: 11/13/2018 20:49    Procedures Procedures (including critical care time)  Medications Ordered in ED Medications  sodium chloride flush (NS) 0.9 % injection 3 mL (3 mLs Intravenous Not Given 11/13/18 2122)     Initial Impression / Assessment and Plan / ED Course  I have reviewed the triage vital signs and the nursing notes.  Pertinent labs & imaging results that were available during my care of the patient were reviewed by me and considered in my medical decision making (see chart for details).        Patient presents with chest wall pain of the left chest.  Pain is easily reproducible, pinpoint.  He had blood work done in triage which shows a negative troponin, no acute electrolyte abnormality, normal white count, normal hemoglobin.  Chest x-ray also ordered in triage shows no acute abnormality.  EKG is normal sinus rhythm.  Reviewed EMR and patient has had many visits similar to this.  History and physical not consistent with PE, dissection, ACS, pericarditis.  Pain appears musculoskeletal in nature however patient declines any treatment, Tylenol, Motrin, lidocaine patch, Voltaren gel.  Encouraged ice and rest.  Patient very reassured by his blood work and imaging today.  Encourage follow-up with primary care.  Given return precautions.  Patient ready and stable for discharge.  Final Clinical Impressions(s) / ED Diagnoses   Final diagnoses:  None    ED Discharge Orders    None        Rachel Moulds 11/13/18 2251    Blanchie Dessert, MD 11/13/18 2338

## 2018-11-13 NOTE — ED Notes (Signed)
Patient verbalizes understanding of discharge instructions. Opportunity for questioning and answers were provided. Armband removed by staff, pt discharged from ED ambulatory.   

## 2018-11-13 NOTE — ED Triage Notes (Signed)
Pt presents with chest discomfort and shob x 1 hour. No n/v/d or fevers. Endorses increased "belching"

## 2018-11-13 NOTE — Discharge Instructions (Signed)
Apply ice or an icy hot patch to affected area.  Try Tylenol or Motrin as needed for pain.  Please follow-up with a primary care provider soon as possible regarding continued chest wall pain.  Return to the ED immediately for new or worsening symptoms or concerns, such as shortness of breath, fevers, vomiting, new or worsening chest pain or any concerns at all.

## 2018-12-15 ENCOUNTER — Encounter (HOSPITAL_COMMUNITY): Payer: Self-pay | Admitting: Emergency Medicine

## 2018-12-15 ENCOUNTER — Emergency Department (HOSPITAL_COMMUNITY)
Admission: EM | Admit: 2018-12-15 | Discharge: 2018-12-16 | Disposition: A | Discharge status: Home / Self Care | Payer: Self-pay | Attending: Emergency Medicine | Admitting: Emergency Medicine

## 2018-12-15 DIAGNOSIS — F17200 Nicotine dependence, unspecified, uncomplicated: Secondary | ICD-10-CM | POA: Insufficient documentation

## 2018-12-15 DIAGNOSIS — I10 Essential (primary) hypertension: Secondary | ICD-10-CM | POA: Insufficient documentation

## 2018-12-15 LAB — BASIC METABOLIC PANEL
Anion gap: 9 (ref 5–15)
BUN: 9 mg/dL (ref 6–20)
CO2: 25 mmol/L (ref 22–32)
Calcium: 9 mg/dL (ref 8.9–10.3)
Chloride: 102 mmol/L (ref 98–111)
Creatinine, Ser: 1.14 mg/dL (ref 0.61–1.24)
GFR calc Af Amer: >60 mL/min (ref >60)
GFR calc non Af Amer: >60 mL/min (ref >60)
Glucose, Bld: 98 mg/dL (ref 70–99)
Potassium: 4 mmol/L (ref 3.5–5.1)
Sodium: 136 mmol/L (ref 135–145)

## 2018-12-15 LAB — URINALYSIS, ROUTINE W REFLEX MICROSCOPIC
Bilirubin Urine: NEGATIVE
Glucose, UA: NEGATIVE mg/dL
Hgb urine dipstick: NEGATIVE
Ketones, ur: NEGATIVE mg/dL
Leukocytes,Ua: NEGATIVE
Nitrite: NEGATIVE
Protein, ur: NEGATIVE mg/dL
Specific Gravity, Urine: 1.015 (ref 1.005–1.030)
pH: 6 (ref 5.0–8.0)

## 2018-12-15 LAB — CBC
HCT: 44.6 % (ref 39.0–52.0)
Hemoglobin: 14.6 g/dL (ref 13.0–17.0)
MCH: 28.1 pg (ref 26.0–34.0)
MCHC: 32.7 g/dL (ref 30.0–36.0)
MCV: 85.8 fL (ref 80.0–100.0)
Platelets: 271 10*3/uL (ref 150–400)
RBC: 5.2 MIL/uL (ref 4.22–5.81)
RDW: 13.1 % (ref 11.5–15.5)
WBC: 5.3 10*3/uL (ref 4.0–10.5)
nRBC: 0 % (ref 0.0–0.2)

## 2018-12-15 MED ORDER — SODIUM CHLORIDE 0.9% FLUSH: 3.0000 mL | Freq: Once | INTRAVENOUS | Status: DC

## 2018-12-15 NOTE — ED Triage Notes (Signed)
Patient reports feeling "off" since last night. He endorses heavy feeling and muscle spasms to posterior neck and L arm as well as shortness of breath? and lightheadedness that has been intermittent since last night. Thinks he has history of HTN but isn't sure. Ambulatory with steady gait. Resp e/u, skin w/d.

## 2018-12-15 NOTE — ED Provider Notes (Signed)
[facility] Provider [facility]   CSN: [id number] Arrival date & time: 12/15/18  1654    History   Chief Complaint Chief Complaint  Patient presents with  . Spasms  . Lightheadedness    HPI Christopher Patton is a 28 y.o. male.     28 year old male with history of hypertension presents to the emergency department with complaints of feeling lightheaded since yesterday evening.  He states that it is difficult for him to characterize what his lightheadedness feels like, but states that he has not come close to passing out and generally feels weak and fatigued.  His symptoms began after eating a hot dog.  He has felt a mild twinge of discomfort in his left upper chest at times.  He feels that he may be experiencing muscle spasms.  His triage note references feeling short of breath, though patient denies this during my assessment with him.  He has not had any fevers, cough, congestion, rhinorrhea, vomiting, diarrhea, abdominal pain.  He does not smoke cigarettes, but does vape.  He is not actively followed by a primary care doctor and is not on any antihypertensive medications.  The history is provided by the patient. No language interpreter was used.    Past Medical History:  Diagnosis Date  . Hypertension     There are no active problems to display for this patient.   History reviewed. No pertinent surgical history.      Home Medications    Prior to Admission medications   Medication Sig Start Date End Date Taking? Authorizing Provider  doxycycline (VIBRA-TABS) 100 MG tablet Take 1 tablet (100 mg total) by mouth 2 (two) times daily. 10/03/18   Elvina SidleLauenstein, Kurt, MD  tobramycin (TOBREX) 0.3 % ophthalmic solution Place 1 drop into the left eye every 6 (six) hours. 10/03/18   Elvina SidleLauenstein, Kurt, MD    Family History Family History  Problem Relation Age of Onset  . Diabetes Maternal Uncle   . Kidney failure Maternal Uncle   . Heart attack Maternal  Grandmother   . CVA Maternal Grandmother   . Diabetes Maternal Uncle     Social History Social History   Tobacco Use  . Smoking status: Current Every Day Smoker    Types: Cigars  . Smokeless tobacco: Never Used  . Tobacco comment: Black & Milds 3-4/day  Substance Use Topics  . Alcohol use: Yes    Comment: Socially   . Drug use: No     Allergies   Patient has no known allergies.   Review of Systems Review of Systems Ten systems reviewed and are negative for acute change, except as noted in the HPI.    Physical Exam Updated Vital Signs BP (!) 156/90   Pulse (!) 58   Temp 98.4 F (36.9 C) (Oral)   Resp 15   SpO2 99%   Physical Exam Vitals signs and nursing note reviewed.  Constitutional:      General: He is not in acute distress.    Appearance: He is well-developed. He is not diaphoretic.     Comments: Nontoxic appearing and in NAD  HENT:     Head: Normocephalic and atraumatic.  Eyes:     General: No scleral icterus.    Conjunctiva/sclera: Conjunctivae normal.  Neck:     Musculoskeletal: Normal range of motion.  Cardiovascular:     Rate and Rhythm: Normal rate and regular rhythm.     Pulses: Normal pulses.  Pulmonary:  Effort: Pulmonary effort is normal. No respiratory distress.     Breath sounds: No stridor. No wheezing, rhonchi or rales.     Comments: Lungs CTAB. Respirations even and unlabored. Musculoskeletal: Normal range of motion.  Skin:    General: Skin is warm and dry.     Coloration: Skin is not pale.     Findings: No erythema or rash.  Neurological:     General: No focal deficit present.     Mental Status: He is alert and oriented to person, place, and time.     Coordination: Coordination normal.     Comments: GCS 15. Moving all extremities spontaneously.  Psychiatric:        Behavior: Behavior normal.      ED Treatments / Results  Labs (all labs ordered are listed, but only abnormal results are displayed) Labs Reviewed  BASIC  METABOLIC PANEL  CBC  URINALYSIS, ROUTINE W REFLEX MICROSCOPIC    EKG EKG Interpretation  Date/Time:  Wednesday December 15 2018 17:28:53 EDT Ventricular Rate:  76 PR Interval:  196 QRS Duration: 84 QT Interval:  380 QTC Calculation: 427 R Axis:   19 Text Interpretation:  Normal sinus rhythm with sinus arrhythmia Possible Anterior infarct , age undetermined Abnormal ECG No acute changes No significant change since last tracing Confirmed by Varney Biles [phone]) on 12/15/2018 11:50:02 PM   Radiology No results found.  Procedures Procedures (including critical care time)  Medications Ordered in ED Medications  sodium chloride flush (NS) 0.9 % injection 3 mL (has no administration in time range)    12:30 AM Orthostatic vital signs are stable.   Initial Impression / Assessment and Plan / ED Course  I have reviewed the triage vital signs and the nursing notes.  Pertinent labs & imaging results that were available during my care of the patient were reviewed by me and considered in my medical decision making (see chart for details).        28 year old male presents for nonspecific complaints of lightheadedness.  This is not necessarily associated with change in position, but began after eating a hot dog.  He has no symptoms concerning for acute coronary syndrome.  EKG was performed in triage and is unchanged.  His laboratory evaluation is also normal, stable.    He has been hypertensive while in the emergency department, but with history of transient hypertension.  Had normal blood pressure when evaluated in the ED a few months ago.  He states that he has been more inactive with a poor diet which may be contributing to his high blood pressure.  Will refer to primary care for outpatient BP recheck.  Return precautions discussed and provided. Patient discharged in stable condition with no unaddressed concerns.   Final Clinical Impressions(s) / ED Diagnoses   Final diagnoses:   Essential hypertension    ED Discharge Orders    None       Antonietta Breach, PA-C 12/16/18 0319    Varney Biles, MD 12/22/18 1511

## 2018-12-16 NOTE — Discharge Instructions (Signed)
We recommend that your blood pressure be reassessed by a primary care doctor within 1 week.  You may need to be started on blood pressure medication for continued management.  Try to decrease your salt intake and drink plenty of water.  Exercise regularly.  You may return for any new or concerning symptoms.

## 2018-12-16 NOTE — ED Notes (Signed)
Discharge instructions including: HTN management and follow up care discussed with pt. Pt verbalized understanding no questions at this time

## 2019-12-05 IMAGING — CR DG CHEST 2V
2 series · 2 of 2 positions shown · non-contrast
Comparison: None.

CLINICAL DATA: 27 y/o  M; heart palpitations and tachycardia today.

EXAM:
CHEST - 2 VIEW

[chest pa]
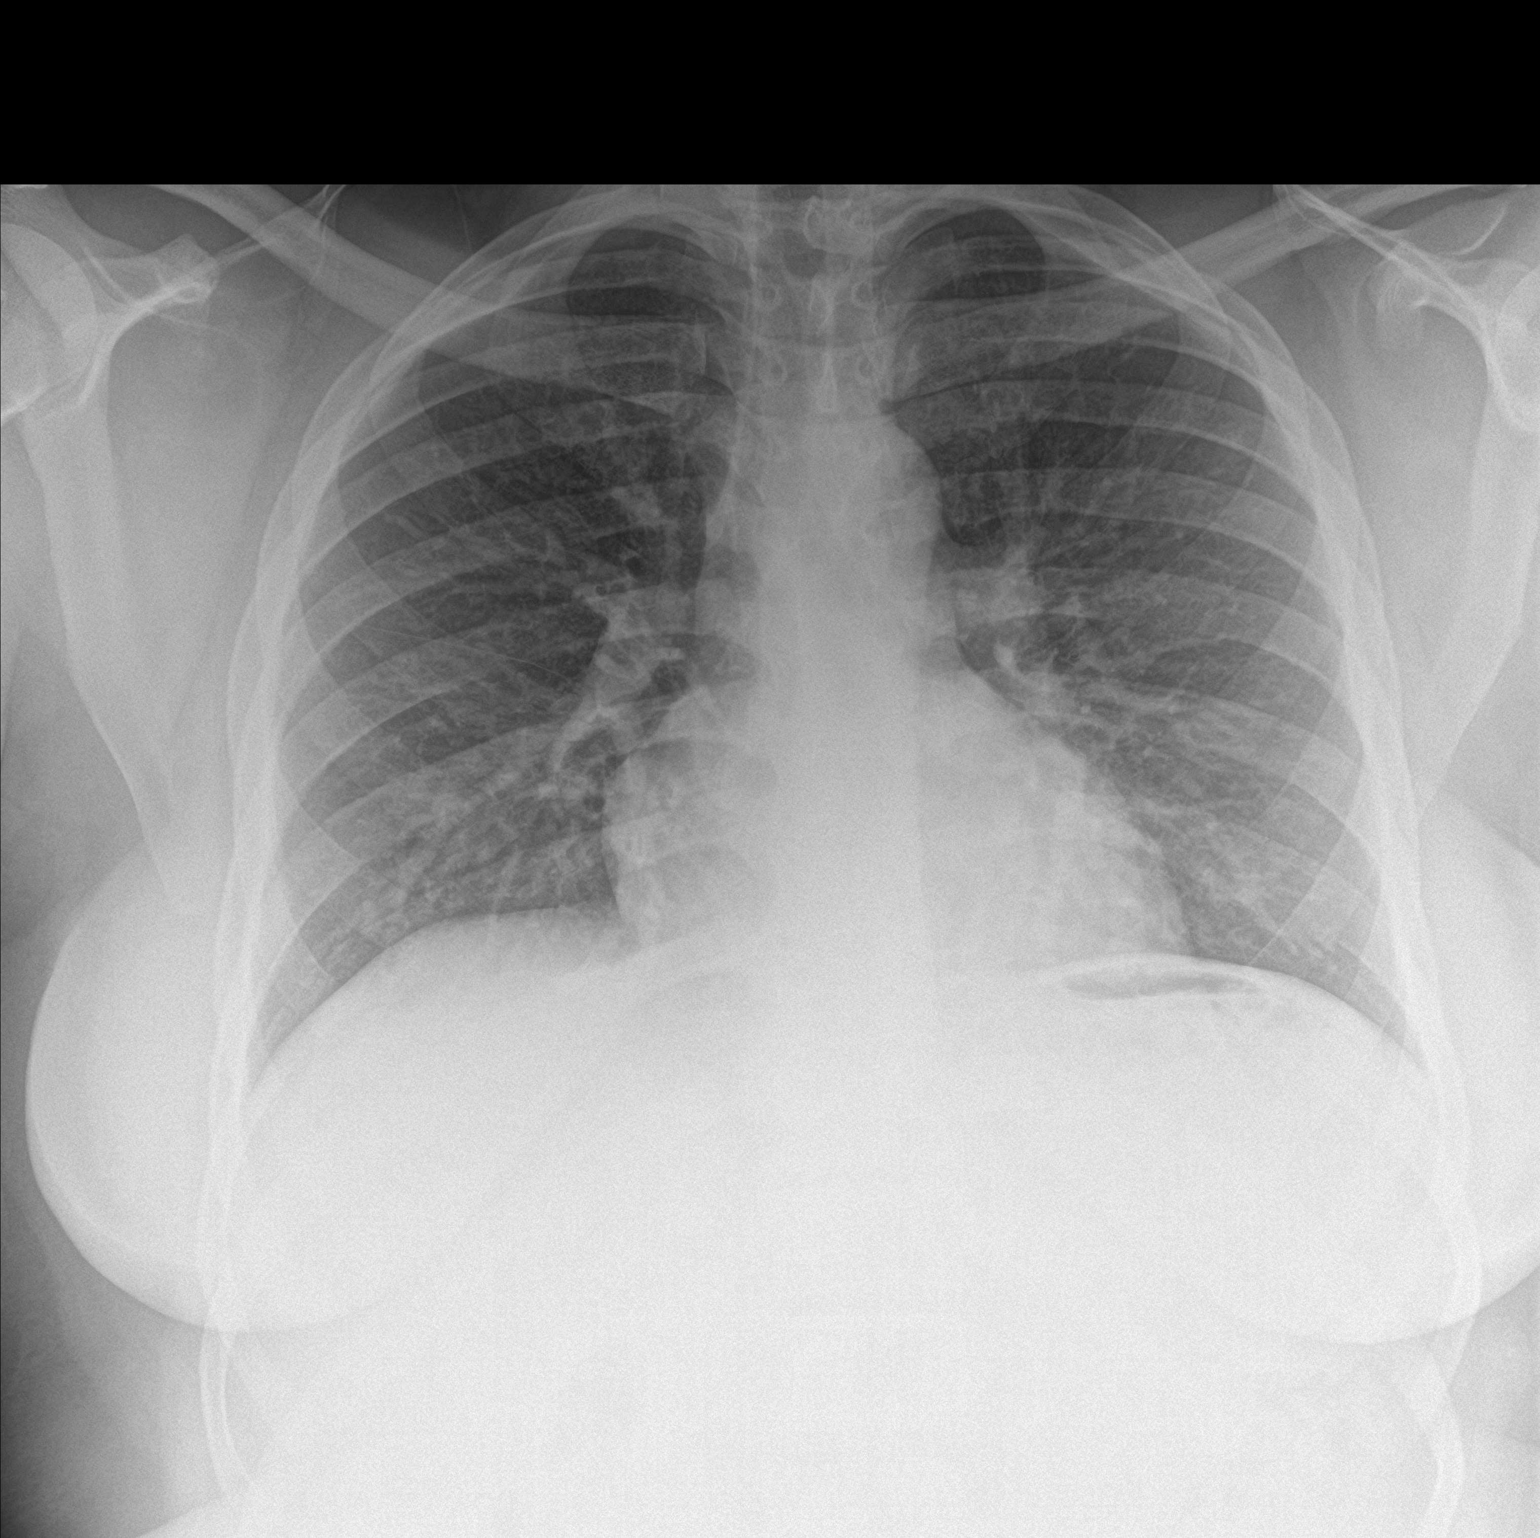

[chest lat]
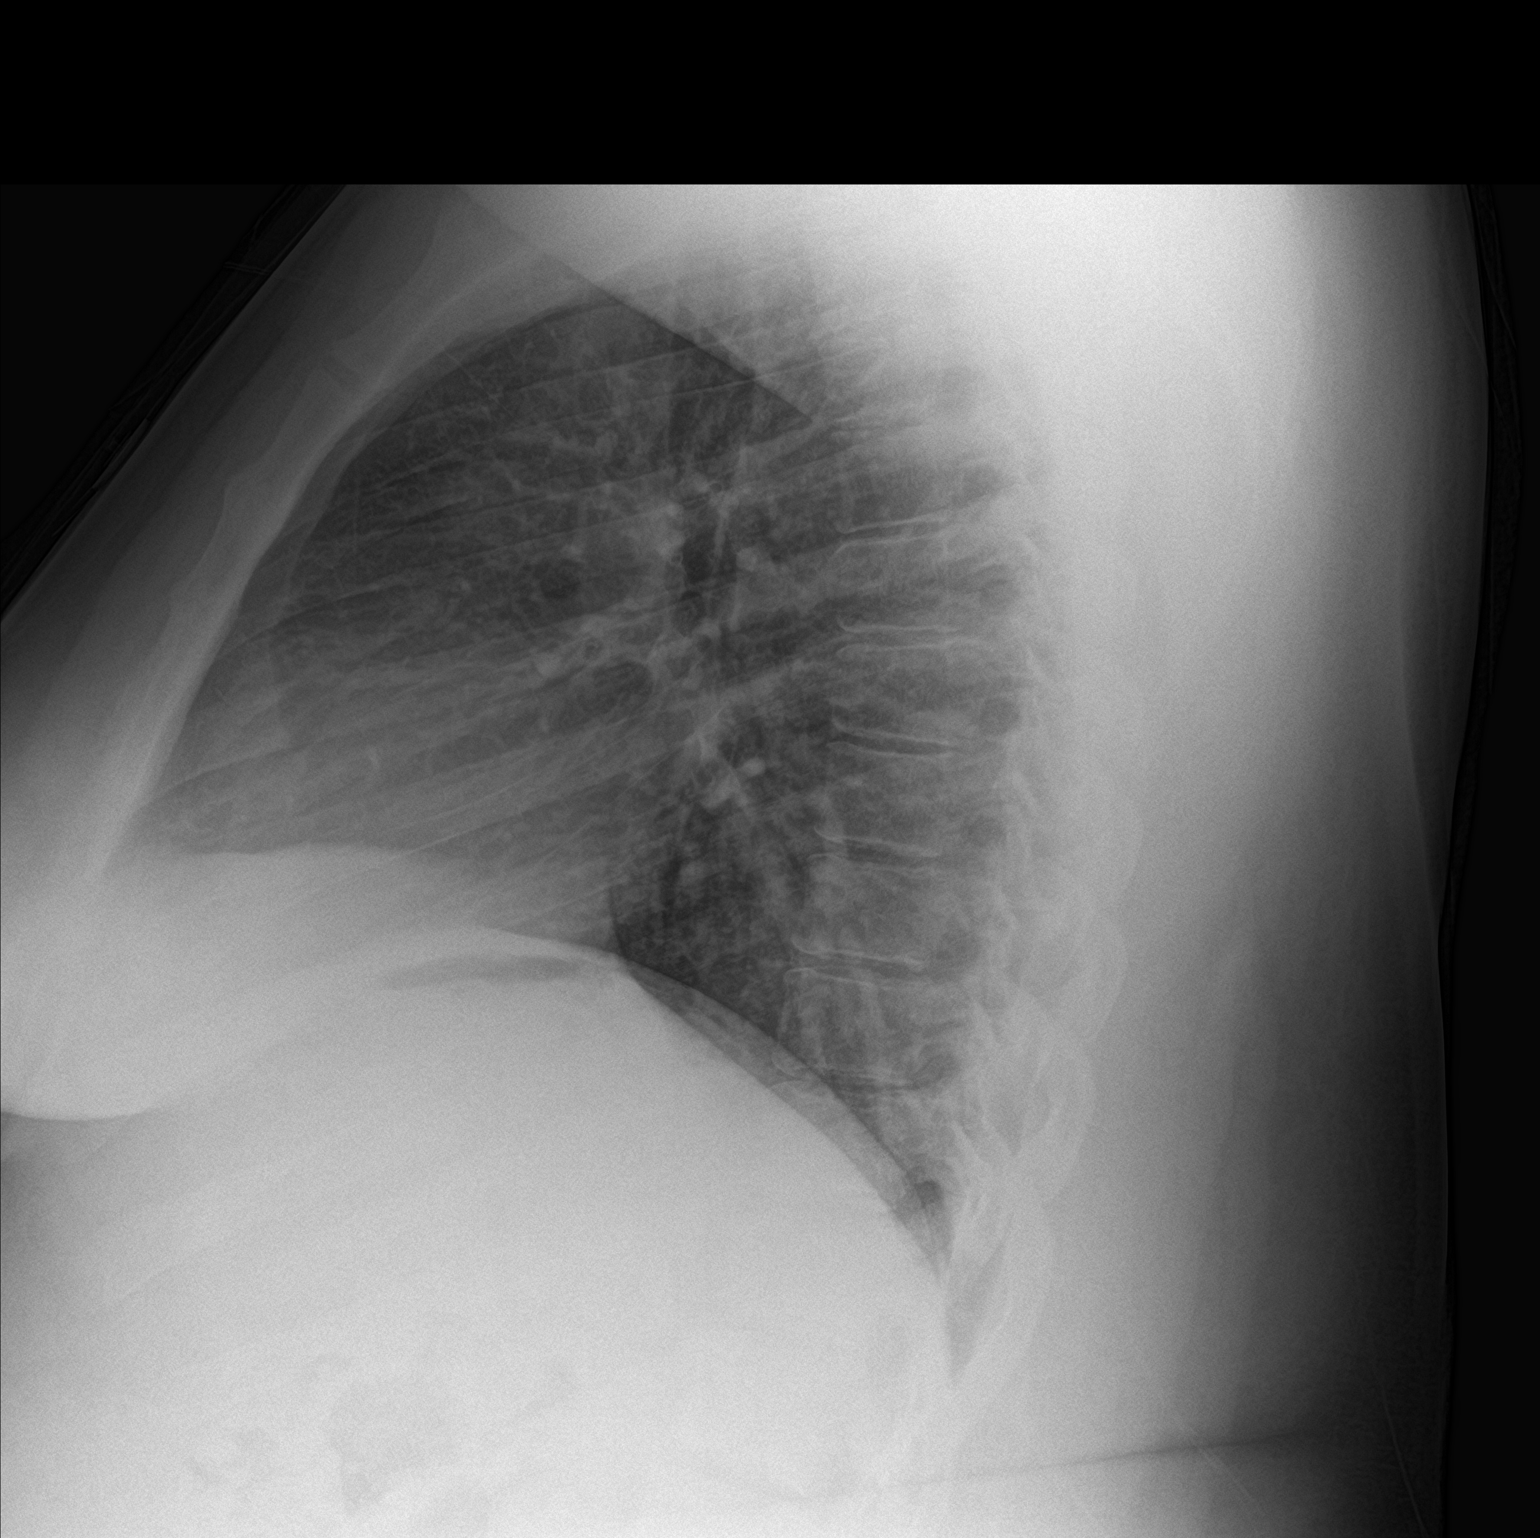

[2 of 2 positions shown; findings below may reference images not displayed]

FINDINGS: The heart size and mediastinal contours are within normal limits.
Pulmonary vascular congestion. No consolidation, effusion, or
pneumothorax. The visualized skeletal structures are unremarkable.
IMPRESSION: Pulmonary vascular congestion.

By: Dapur Ling Ling M.D.

## 2021-02-09 DIAGNOSIS — I10 Essential (primary) hypertension: Secondary | ICD-10-CM | POA: Insufficient documentation

## 2021-02-09 DIAGNOSIS — R072 Precordial pain: Secondary | ICD-10-CM | POA: Insufficient documentation

## 2021-02-09 DIAGNOSIS — F17210 Nicotine dependence, cigarettes, uncomplicated: Secondary | ICD-10-CM | POA: Insufficient documentation

## 2021-02-10 ENCOUNTER — Other Ambulatory Visit: Payer: Self-pay

## 2021-02-10 ENCOUNTER — Emergency Department (HOSPITAL_COMMUNITY): Payer: Self-pay

## 2021-02-10 ENCOUNTER — Emergency Department (HOSPITAL_COMMUNITY)
Admission: EM | Admit: 2021-02-10 | Discharge: 2021-02-10 | Disposition: A | Discharge status: Home / Self Care | Payer: Self-pay | Attending: Emergency Medicine | Admitting: Emergency Medicine

## 2021-02-10 DIAGNOSIS — R072 Precordial pain: Secondary | ICD-10-CM

## 2021-02-10 LAB — CBC WITH DIFFERENTIAL/PLATELET
Abs Immature Granulocytes: 0.01 10*3/uL (ref 0.00–0.07)
Basophils Absolute: 0 10*3/uL (ref 0.0–0.1)
Basophils Relative: 1 %
Eosinophils Absolute: 0.2 10*3/uL (ref 0.0–0.5)
Eosinophils Relative: 5 %
HCT: 47.3 % (ref 39.0–52.0)
Hemoglobin: 15.5 g/dL (ref 13.0–17.0)
Immature Granulocytes: 0 %
Lymphocytes Relative: 35 %
Lymphs Abs: 1.5 10*3/uL (ref 0.7–4.0)
MCH: 27.1 pg (ref 26.0–34.0)
MCHC: 32.8 g/dL (ref 30.0–36.0)
MCV: 82.7 fL (ref 80.0–100.0)
Monocytes Absolute: 0.6 10*3/uL (ref 0.1–1.0)
Monocytes Relative: 13 %
Neutro Abs: 1.9 10*3/uL (ref 1.7–7.7)
Neutrophils Relative %: 46 %
Platelets: 260 10*3/uL (ref 150–400)
RBC: 5.72 MIL/uL (ref 4.22–5.81)
RDW: 13.8 % (ref 11.5–15.5)
WBC: 4.3 10*3/uL (ref 4.0–10.5)
nRBC: 0 % (ref 0.0–0.2)

## 2021-02-10 LAB — COMPREHENSIVE METABOLIC PANEL
ALT: 46 U/L — ABNORMAL HIGH (ref 0–44)
AST: 31 U/L (ref 15–41)
Albumin: 3.6 g/dL (ref 3.5–5.0)
Alkaline Phosphatase: 76 U/L (ref 38–126)
Anion gap: 12 (ref 5–15)
BUN: 9 mg/dL (ref 6–20)
CO2: 23 mmol/L (ref 22–32)
Calcium: 9.2 mg/dL (ref 8.9–10.3)
Chloride: 102 mmol/L (ref 98–111)
Creatinine, Ser: 1.04 mg/dL (ref 0.61–1.24)
GFR, Estimated: >60 mL/min (ref >60)
Glucose, Bld: 122 mg/dL — ABNORMAL HIGH (ref 70–99)
Potassium: 3.8 mmol/L (ref 3.5–5.1)
Sodium: 137 mmol/L (ref 135–145)
Total Bilirubin: 0.5 mg/dL (ref 0.3–1.2)
Total Protein: 7.3 g/dL (ref 6.5–8.1)

## 2021-02-10 LAB — TROPONIN I (HIGH SENSITIVITY)
Troponin I (High Sensitivity): 7 ng/L (ref <18)
Troponin I (High Sensitivity): 7 ng/L (ref <18)

## 2021-02-10 LAB — LIPASE, BLOOD: Lipase: 33 U/L (ref 11–51)

## 2021-02-10 NOTE — ED Provider Notes (Signed)
Emergency Medicine Provider Triage Evaluation Note  Christopher Patton , a 30 y.o. male  was evaluated in triage.  Pt complains of chest pain.  The patient reports central chest pain characterizes cramping that has been constant since onset yesterday.  It radiates through to his back.  He intermittently describes the pain as burning.  He states it almost feels like heartburn with some muscle cramps and stabbing pain.  He denies shortness of breath, leg swelling, palpitations, dizziness, lightheadedness, fever, chills, cough, abdominal pain, vomiting, or diarrhea.  States he has a history of similar chest pain several years ago and the pain eventually improved with stretching.  He denies illicit or recreational substance use.  He is a current, everyday smoker.  No recent falls or injuries.  Review of Systems  Positive: Chest pain, back pain Negative: Abdominal pain, palpitations, shortness of breath, cough, dizziness, lightheadedness, fever, chills, vomiting, diarrhea, leg swelling  Physical Exam  BP (!) 130/92 (BP Location: Right Arm)   Pulse 94   Temp 97.9 F (36.6 C) (Oral)   Resp 18   Ht 6\' 5"  (1.956 m)   Wt (!) 148.3 kg   SpO2 97%   BMI 38.78 kg/m  Gen:   Awake, no distress   Resp:  Normal effort  MSK:   Moves extremities without difficulty  Other:  No reproducible tenderness palpation to the chest wall.  Abdomen is nontender.  Medical Decision Making  Medically screening exam initiated at 12:56 AM.  Appropriate orders placed.  Ulus Escue was informed that the remainder of the evaluation will be completed by another provider, this initial triage assessment does not replace that evaluation, and the importance of remaining in the ED until their evaluation is complete.  Labs and imaging of been ordered.  He will require further work-up and evaluation in the emergency department   Devann Cribb A, PA-C 02/10/21 0058    04/12/21, MD 02/10/21 [phone]

## 2021-02-10 NOTE — ED Provider Notes (Signed)
[facility] EMERGENCY DEPARTMENT Provider Note   CSN: [id number] Arrival date & time: 02/09/21  2356     History Chief Complaint  Patient presents with   Chest Pain    Christopher Patton is a 30 y.o. male with a history of hypertension (not currently on any medication).  Patient presents to the emergency department with a chief complaint of chest pain.  Patient reports that chest pain started yesterday approximately 1200 while he was washing close.  Patient reports that pain was located to the left side of his chest and wraps around to his back.  Patient described pain as a "tightness and cramping."  Patient rated pain 5/10 on the pain scale.  No aggravating factors.  Patient did not try any modalities to alleviate his symptoms.  Patient reports that his chest pain gradually resolved over time.  Patient has not had any pain since 0 400.No associated shortness of breath, nausea, vomiting, or diaphoresis.  Patient endorses cigarette smoking.  Denies any illicit drug use.  No recent falls or injuries.  Patient has any history of PE or DVT, recent surgery last 4 weeks, cancer treatment, no lateral leg swelling or tenderness, hormone therapy.   Chest Pain Associated symptoms: no abdominal pain, no back pain, no dizziness, no fever, no headache, no nausea, no palpitations, no shortness of breath and no vomiting       Past Medical History:  Diagnosis Date   Hypertension     There are no problems to display for this patient.   No past surgical history on file.     Family History  Problem Relation Age of Onset   Diabetes Maternal Uncle    Kidney failure Maternal Uncle    Heart attack Maternal Grandmother    CVA Maternal Grandmother    Diabetes Maternal Uncle     Social History   Tobacco Use   Smoking status: Every Day    Types: Cigars   Smokeless tobacco: Never   Tobacco comments:    Black & Milds 3-4/day  Vaping Use   Vaping Use: Never used  Substance Use  Topics   Alcohol use: Yes    Comment: Socially    Drug use: No    Home Medications Prior to Admission medications   Medication Sig Start Date End Date Taking? Authorizing Provider  doxycycline (VIBRA-TABS) 100 MG tablet Take 1 tablet (100 mg total) by mouth 2 (two) times daily. 10/03/18   Elvina Sidle, MD  tobramycin (TOBREX) 0.3 % ophthalmic solution Place 1 drop into the left eye every 6 (six) hours. 10/03/18   Elvina Sidle, MD    Allergies    Patient has no known allergies.  Review of Systems   Review of Systems  Constitutional:  Negative for chills and fever.  Eyes:  Negative for visual disturbance.  Respiratory:  Negative for shortness of breath.   Cardiovascular:  Positive for chest pain. Negative for palpitations and leg swelling.  Gastrointestinal:  Negative for abdominal pain, nausea and vomiting.  Genitourinary:  Negative for difficulty urinating.  Musculoskeletal:  Negative for back pain and neck pain.  Skin:  Negative for color change and rash.  Neurological:  Negative for dizziness, syncope, light-headedness and headaches.  Psychiatric/Behavioral:  Negative for confusion.    Physical Exam Updated Vital Signs BP 136/84 (BP Location: Left Arm)   Pulse 71   Temp 98.9 F (37.2 C) (Oral)   Resp 16   Ht 6\' 5"  (1.956 m)   Wt )  148.3 kg   SpO2 100%   BMI 38.78 kg/m   Physical Exam Vitals and nursing note reviewed.  Constitutional:      General: He is not in acute distress.    Appearance: He is obese. He is not ill-appearing, toxic-appearing or diaphoretic.  HENT:     Head: Normocephalic.  Eyes:     General: No scleral icterus.       Right eye: No discharge.        Left eye: No discharge.  Cardiovascular:     Rate and Rhythm: Normal rate.     Pulses:          Radial pulses are 2+ on the right side and 2+ on the left side.     Heart sounds: Normal heart sounds.  Pulmonary:     Effort: Pulmonary effort is normal. No tachypnea, bradypnea or  respiratory distress.     Breath sounds: Normal breath sounds. No stridor.  Abdominal:     General: There is no distension. There are no signs of injury.     Palpations: Abdomen is soft. There is no mass or pulsatile mass.     Tenderness: There is no abdominal tenderness. There is no guarding or rebound.     Hernia: There is no hernia in the umbilical area or ventral area.  Musculoskeletal:     Cervical back: Neck supple.     Right lower leg: Normal.     Left lower leg: Normal.  Skin:    General: Skin is warm and dry.  Neurological:     General: No focal deficit present.     Mental Status: He is alert.  Psychiatric:        Behavior: Behavior is cooperative.    ED Results / Procedures / Treatments   Labs (all labs ordered are listed, but only abnormal results are displayed) Labs Reviewed  COMPREHENSIVE METABOLIC PANEL - Abnormal; Notable for the following components:      Result Value   Glucose, Bld 122 (*)    ALT 46 (*)    All other components within normal limits  CBC WITH DIFFERENTIAL/PLATELET  LIPASE, BLOOD  TROPONIN I (HIGH SENSITIVITY)  TROPONIN I (HIGH SENSITIVITY)    EKG EKG Interpretation  Date/Time:  Sunday February 10 2021 00:18:00 EDT Ventricular Rate:  93 PR Interval:  192 QRS Duration: 82 QT Interval:  354 QTC Calculation: 440 R Axis:   51 Text Interpretation: Normal sinus rhythm with sinus arrhythmia Cannot rule out Anterior infarct , age undetermined Abnormal ECG No acute changes Confirmed by Drema Pry [phone]) on 02/10/2021 4:31:00 AM  Radiology DG Chest 2 View  Result Date: 02/10/2021 CLINICAL DATA:  Central chest pain. EXAM: CHEST - 2 VIEW COMPARISON:  June 11 20 FINDINGS: The heart size and mediastinal contours are within normal limits. Both lungs are clear. The visualized skeletal structures are unremarkable. IMPRESSION: No active cardiopulmonary disease. Electronically Signed   By: Sherian Rein M.D.   On: 02/10/2021 10:35     Procedures Procedures   Medications Ordered in ED Medications - No data to display  ED Course  I have reviewed the triage vital signs and the nursing notes.  Pertinent labs & imaging results that were available during my care of the patient were reviewed by me and considered in my medical decision making (see chart for details).    MDM Rules/Calculators/A&P  Alert 30 year old male no acute stress, nontoxic-appearing.  Presents emergency department with complaint of chest pain.  Chest pain started noon yesterday, gradually resolved over time and has not had any chest pain since 0 400.  Pain wraps around to patient's back.  ACS work-up initiated while patient was in triage. EKG shows normal sinus rhythm with sinus arrhythmia, no significant change from previous Chest x-ray shows no active cardiopulmonary disease Troponin 7 & 7 with delta of 0 Heart score 3 Low suspicion for ACS at this time  Suspicion for PE as patient can be ruled out via [facility]. Suspicion for acute pancreatitis as lipase within normal limits.  Patient hemodynamically stable.  No chest pain at present.  Will have patient follow-up in outpatient setting with PCP. Discussed results, findings, treatment and follow up. Patient advised of return precautions. Patient verbalized understanding and agreed with plan.   Final Clinical Impression(s) / ED Diagnoses Final diagnoses:  Precordial chest pain    Rx / DC Orders ED Discharge Orders     None        Berneice Heinrich 02/10/21 1107    Gerhard Munch, MD 02/10/21 1651

## 2021-02-10 NOTE — Discharge Instructions (Signed)
You came to the [facility] apartment today to be evaluated for your chest pain.  Your physical exam was reassuring.  Your EKG, chest x-ray, and lab work were reassuring you are not having acute heart attack today.  Please make appointment to follow-up with your primary care provider in the outpatient setting.  Get help right away if: Your chest pain gets worse. You have a cough that gets worse, or you cough up blood. You have severe pain in your abdomen. You faint. You have sudden, unexplained chest discomfort. You have sudden, unexplained discomfort in your arms, back, neck, or jaw. You have shortness of breath at any time. You suddenly start to sweat, or your skin gets clammy. You feel nausea or you vomit. You suddenly feel lightheaded or dizzy. You have severe weakness, or unexplained weakness or fatigue. Your heart begins to beat quickly, or it feels like it is skipping beats.

## 2021-02-10 NOTE — ED Triage Notes (Signed)
Pt c/o new onset constant centralized chest pain rating 5/10 that started yesterday while washing clothes and described as cramping and tightness that radiates to the back stabbing pain and c/o heartburn. No SOB. No cardiac history.

## 2021-02-12 IMAGING — DX CHEST - 2 VIEW
2 series · 2 of 2 positions shown · non-contrast
Comparison: September 26, 2018

CLINICAL DATA: Chest pain.

EXAM:
CHEST - 2 VIEW

[w chest pa]
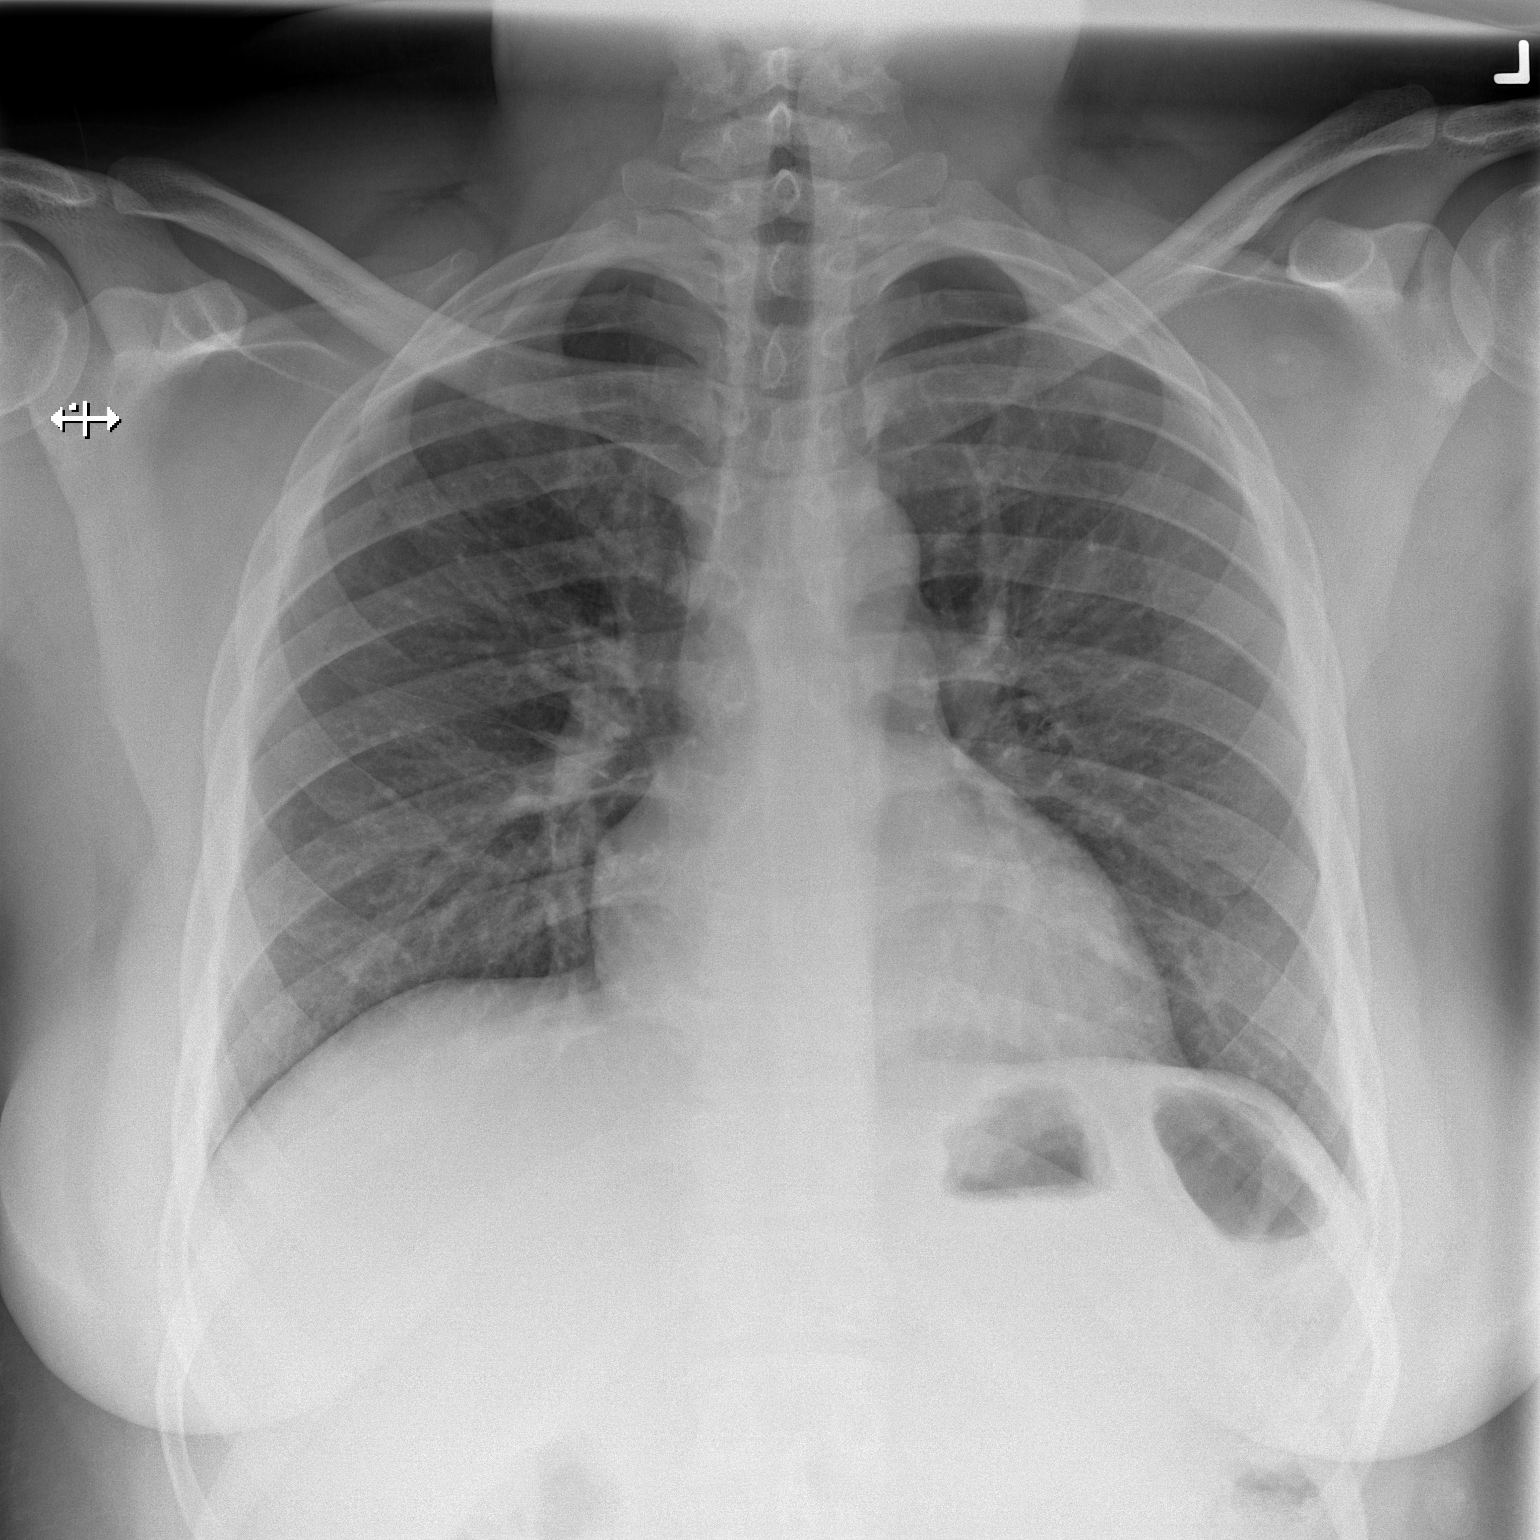

[w chest lat]
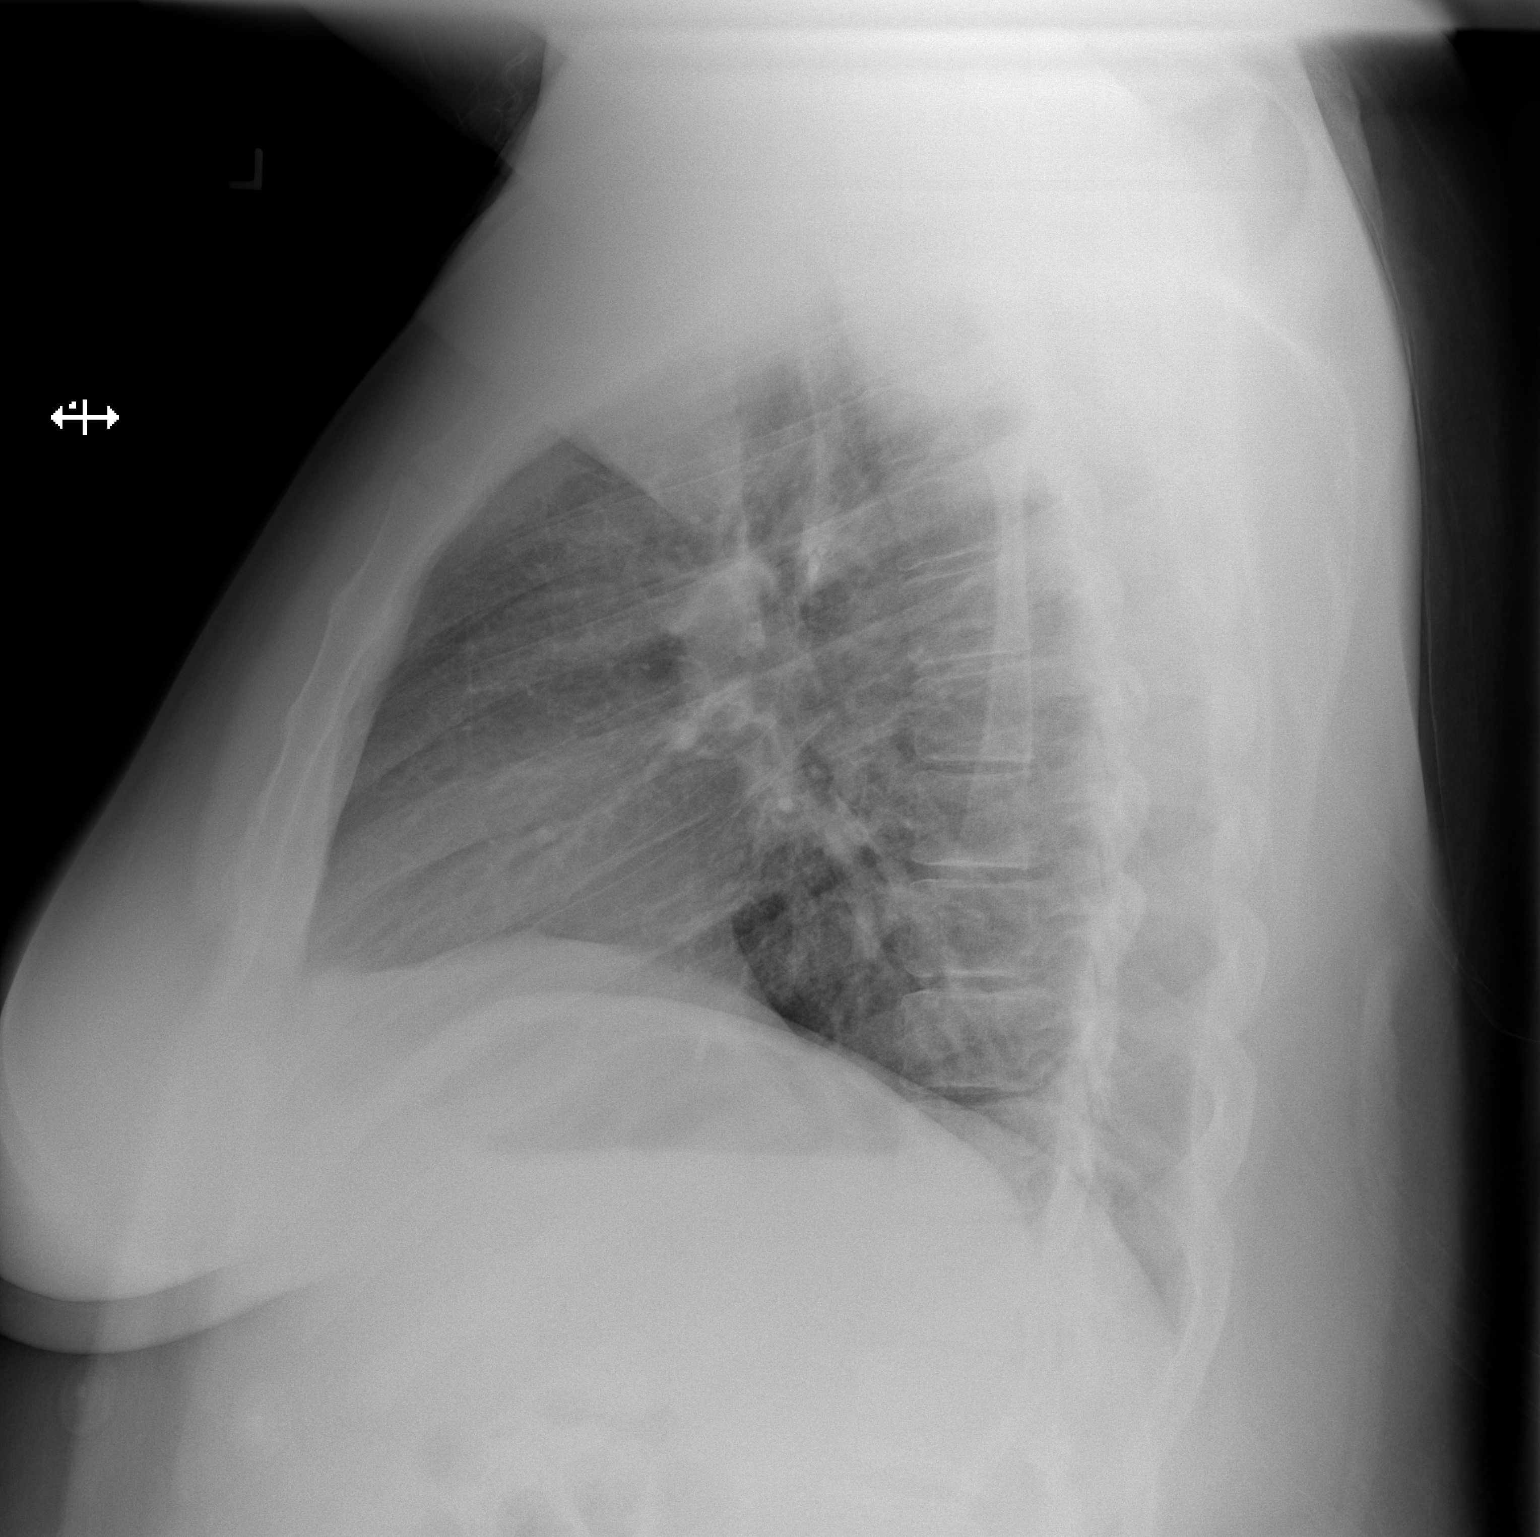

[2 of 2 positions shown; findings below may reference images not displayed]

FINDINGS: The heart size and mediastinal contours are within normal limits.
Both lungs are clear. The visualized skeletal structures are
unremarkable.
IMPRESSION: No active cardiopulmonary disease.
